# Patient Record
Sex: Female | Born: 1975
Health system: Southern US, Community
[De-identification: ages and names within clinical notes are randomized; demographics above are authoritative.]

## PROBLEM LIST (undated history)

## (undated) ENCOUNTER — Inpatient Hospital Stay (HOSPITAL_COMMUNITY): Payer: Self-pay

## (undated) DIAGNOSIS — T7840XA Allergy, unspecified, initial encounter: Secondary | ICD-10-CM

## (undated) DIAGNOSIS — G43909 Migraine, unspecified, not intractable, without status migrainosus: Secondary | ICD-10-CM

## (undated) DIAGNOSIS — D649 Anemia, unspecified: Secondary | ICD-10-CM

## (undated) DIAGNOSIS — K5900 Constipation, unspecified: Secondary | ICD-10-CM

## (undated) HISTORY — DX: Migraine, unspecified, not intractable, without status migrainosus: G43.909

## (undated) HISTORY — PX: HERNIA REPAIR: SHX51

## (undated) HISTORY — DX: Constipation, unspecified: K59.00

## (undated) HISTORY — DX: Allergy, unspecified, initial encounter: T78.40XA

## (undated) HISTORY — DX: Anemia, unspecified: D64.9

## (undated) HISTORY — PX: WISDOM TOOTH EXTRACTION: SHX21

---

## 1999-12-22 ENCOUNTER — Other Ambulatory Visit: Admission: RE | Admit: 1999-12-22 | Discharge: 1999-12-22 | Payer: Self-pay | Admitting: *Deleted

## 2000-04-09 ENCOUNTER — Inpatient Hospital Stay (HOSPITAL_COMMUNITY): Admission: AD | Admit: 2000-04-09 | Discharge: 2000-04-09 | Payer: Self-pay | Admitting: Obstetrics and Gynecology

## 2000-08-24 ENCOUNTER — Inpatient Hospital Stay (HOSPITAL_COMMUNITY): Admission: AD | Admit: 2000-08-24 | Discharge: 2000-08-28 | Payer: Self-pay | Admitting: Obstetrics and Gynecology

## 2000-08-24 ENCOUNTER — Encounter (INDEPENDENT_AMBULATORY_CARE_PROVIDER_SITE_OTHER): Payer: Self-pay | Admitting: Specialist

## 2001-02-21 ENCOUNTER — Emergency Department (HOSPITAL_COMMUNITY): Admission: EM | Admit: 2001-02-21 | Discharge: 2001-02-21 | Payer: Self-pay | Admitting: Emergency Medicine

## 2001-02-21 ENCOUNTER — Encounter: Payer: Self-pay | Admitting: Emergency Medicine

## 2004-01-07 ENCOUNTER — Other Ambulatory Visit: Admission: RE | Admit: 2004-01-07 | Discharge: 2004-01-07 | Payer: Self-pay | Admitting: Family Medicine

## 2004-10-13 ENCOUNTER — Other Ambulatory Visit: Admission: RE | Admit: 2004-10-13 | Discharge: 2004-10-13 | Payer: Self-pay | Admitting: Obstetrics and Gynecology

## 2007-08-24 DIAGNOSIS — J45909 Unspecified asthma, uncomplicated: Secondary | ICD-10-CM

## 2007-08-24 HISTORY — DX: Unspecified asthma, uncomplicated: J45.909

## 2007-08-29 DIAGNOSIS — J309 Allergic rhinitis, unspecified: Secondary | ICD-10-CM

## 2007-08-29 HISTORY — DX: Allergic rhinitis, unspecified: J30.9

## 2007-11-11 DIAGNOSIS — N946 Dysmenorrhea, unspecified: Secondary | ICD-10-CM

## 2007-11-11 HISTORY — DX: Dysmenorrhea, unspecified: N94.6

## 2008-08-06 ENCOUNTER — Encounter: Admission: RE | Admit: 2008-08-06 | Discharge: 2008-08-06 | Payer: Self-pay | Admitting: Family Medicine

## 2010-10-10 NOTE — Op Note (Signed)
[facility]  Patient:    Mallory Martin, Mallory Martin                      MRN: [id number] Proc. Date: 08/25/00 Adm. Date:  [id number] Attending:  Leonard Schwartz                           Operative Report  PREOPERATIVE DIAGNOSES:       1. A 41-1/[redacted] week gestation.                               2. Arrest of active phase of labor.  POSTOPERATIVE DIAGNOSES:      1. A 41-1/[redacted] week gestation.                               2. Arrest of active phase of labor.                               3. Left dermoid cyst.  PROCEDURES:                   1. Primary low transverse cesarean section.                               2. Left ovarian cystectomy.  SURGEON:                      Janine Limbo, M.D.  FIRST ASSISTANT:              Nigel Bridgeman, C.N.M.  ANESTHESIA:                   Epidural.  INDICATIONS:                  The patient is a 35 year old female, gravida 1, para 0 who presents at 41-1/[redacted] weeks gestation for induction of labor.  She has been followed at [facility] for this pregnancy, which has been complicated by a history of asthma, first trimester bleeding, history of migraine headaches and a history of a first trimester Chlamydia cervicitis infection.  She was started on Pitocin and dilated her cervix to 6-7 cm.  She dilated no further in spite of greater than 200 Montevideo units for greater than two hours.  The decision was made to proceed with cesarean delivery.  The risks and benefits of the procedure were reviewed including but not limited to anesthetic complications, bleeding, infection and possible damage to the surrounding organs.  FINDINGS:                     An 8 lb 2 oz female infant (Dorian) was delivered from an occiput posterior presentation.  Apgars were 8 at one minute and 9 at five minutes.  The uterus, fallopian tubes and right ovary were normal.  The left ovary contained a 5 cm dermoid  cyst.  DESCRIPTION OF PROCEDURE:     The patient was taken to the operating room, where it was determined that the epidural she had received for labor would be adequate for cesarean delivery.  The patients abdomen was prepped with multiple layers of Betadine and then  sterilely draped.  The bladder had previously been drained with Foley catheter.  A low transverse incision was made in the abdomen and carried sharply through the subcutaneous tissue, the fascia and the anterior peritoneum.  An incision was made in the lower uterine segment and extended transversely.  The fetal head was delivered without difficulty.  The mouth and nose were suctioned.  The remainder of the infant was delivered.  The cord was clamped and cut.  The infant was handed to the awaiting pediatric team.  Routine cord blood studies were obtained.  The placenta was removed.  The uterine cavity was cleaned of amniotic fluid and clotted blood.  The uterine incision was closed using a running locking suture of 2-0 Vicryl.  The uterus was slightly boggy and, therefore, uterine massage was employed as well as IV Pitocin at a vigorous rate.  The pericolonic gutters were cleaned of amniotic fluid and clotted blood.  The left ovary was discovered to have a 5 cm dermoid cyst.  An incision was made in the capsule of the ovary and then a combination of sharp, blunt and hydrodissection were used to remove the cyst without rupturing it.  The ovary was closed using a running suture of 3-0 Vicryl.  Hemostasis was adequate throughout.  The pelvis was again irrigated.  The abdominal musculature and the anterior peritoneum were closed using interrupted sutures of 2-0 Vicryl.  The subcutaneous layer, the fascia and the abdominal musculature were irrigated.  Hemostasis was adequate.  The fascia was closed using a running suture of 0 Vicryl followed by three interrupted sutures of 0 Vicryl.  The subcutaneous layer was closed using a running  suture of 2-0 Vicryl.  The skin was reapproximated using skin staples.  Sponge, needle and instrument counts were correct on two occasions. Estimated blood loss for the procedure was 900 cc.  The patient tolerated the procedure well.  She was noted to drain clear yellow urine at the end of her procedure.  The patient was taken to the recovery room in stable condition. The infant was taken to the full term nursery in stable condition. DD:  08/25/00 TD:  08/26/00 Job: [id number]

## 2010-10-10 NOTE — Discharge Summary (Signed)
[facility]  Patient:    Mallory Martin, Mallory Martin                      MRN: [id number] Adm. Date:  [id number] Disc. Date: 08/28/00 Attending:  Leonard Schwartz Dictator:   Vance Gather Duplantis, C.N.M.                           Discharge Summary  ADMISSION DIAGNOSES:          1. Intrauterine pregnancy at 41-4/7 weeks.                               2. Unfavorable cervix.  DISCHARGE DIAGNOSES:          1. Intrauterine pregnancy at 41-4/7 weeks.                               2. Unfavorable cervix.                               3. Failure to progress in labor status post low                                  transverse cesarean section.                               4. Left dermoid ovarian cyst.                               5. Breast and bottle feeding.                               6. Decided on Micronor for contraception.  PROCEDURES:                   Primary low transverse cesarean section for delivery of a viable female infant named Mallory Martin who weighed 8 pounds 2 ounces with Apgars of 8/9 by Dr. Leonard Schwartz.  During the C section, he also removed a 5 cm left ovarian dermoid cyst.  HOSPITAL COURSE:              Mallory Martin is a 35 year old single black female, gravida 1, para 0, at 41-4/7 weeks admitted for induction of labor secondary to being post dates.  She was admitted for cervical ripening on low-dose Pitocin throughout the night and started that, increasing on the morning of August 25, 2000.  She progressed to approximately 6 to 7 cm but failed to continue to progress even with adequate Montevideo units for more than two hours.  At this point, it was recommended to proceed with a cesarean section. The patient agreed and underwent the same for delivery of a viable female infant named Mallory Martin who weighted 8 pounds 2 ounces and had Apgars of 8/9.  She was attended at her C section by Dr. Leonard Schwartz and Nigel Bridgeman, C.N.M.  Postoperatively, she has done well.  She is ambulating, voiding, and eating without difficulty.  She is breast feeding and bottle feeding also  without difficulty.  Her vital signs are stable, and she is afebrile.  She is deemed ready for discharge today.  DISCHARGE INSTRUCTIONS:       As per the [facility] handout.  DISCHARGE MEDICATIONS:        1. Motrin 600 mg p.o. q.6h. p.r.n. pain.                               2. Darvocet 1 to 2 p.o. q.4-6h. p.r.n. pain.                               3. Prenatal vitamins daily.  DISCHARGE LABORATORY DATA:    Hemoglobin 11.2, WBC count 19.2, platelets 288.  DISCHARGE FOLLOWUP:           [facility] in approximately six weeks or p.r.n. DD:  08/28/00 TD:  08/28/00 Job: [id number]
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
## 2011-10-20 ENCOUNTER — Encounter (HOSPITAL_COMMUNITY): Payer: Self-pay | Admitting: *Deleted

## 2011-10-20 ENCOUNTER — Emergency Department (HOSPITAL_COMMUNITY)
Admission: EM | Admit: 2011-10-20 | Discharge: 2011-10-20 | Disposition: A | Discharge status: Home / Self Care | Payer: No Typology Code available for payment source | Attending: Emergency Medicine | Admitting: Emergency Medicine

## 2011-10-20 ENCOUNTER — Emergency Department (HOSPITAL_COMMUNITY): Payer: No Typology Code available for payment source

## 2011-10-20 DIAGNOSIS — M25569 Pain in unspecified knee: Secondary | ICD-10-CM | POA: Insufficient documentation

## 2011-10-20 DIAGNOSIS — M25519 Pain in unspecified shoulder: Secondary | ICD-10-CM | POA: Insufficient documentation

## 2011-10-20 DIAGNOSIS — M545 Low back pain, unspecified: Secondary | ICD-10-CM

## 2011-10-20 DIAGNOSIS — J45909 Unspecified asthma, uncomplicated: Secondary | ICD-10-CM | POA: Insufficient documentation

## 2011-10-20 MED ORDER — HYDROCODONE-ACETAMINOPHEN 5-325 MG PO TABS: 1.0000 | ORAL_TABLET | ORAL | Status: AC | PRN

## 2011-10-20 MED ORDER — HYDROCODONE-ACETAMINOPHEN 5-325 MG PO TABS
1.0000 | ORAL_TABLET | Freq: Once | ORAL | Status: AC
  Administered 2011-10-20: 1 via ORAL
  Filled 2011-10-20: qty 1

## 2011-10-20 NOTE — ED Provider Notes (Signed)
Medical screening examination/treatment/procedure(s) were performed by non-physician practitioner and as supervising physician I was immediately available for consultation/collaboration.   Celene Kras, MD 10/20/11 2105
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
## 2011-10-21 NOTE — ED Notes (Signed)
Requests something for nausea; chart to EDP office for review.

## 2011-10-22 NOTE — ED Notes (Signed)
Prescription called in to [facility] [address] at [phone] for zofran 4mg  one tab po q4h prn nausea/vomiting, dispense 12, no refills per stephanie wolfe, pa-c; message left for pt to return call for notification that med has been called in.

## 2011-11-23 DIAGNOSIS — R768 Other specified abnormal immunological findings in serum: Secondary | ICD-10-CM

## 2011-11-23 HISTORY — DX: Other specified abnormal immunological findings in serum: R76.8

## 2011-12-02 ENCOUNTER — Emergency Department (HOSPITAL_COMMUNITY)
Admission: EM | Admit: 2011-12-02 | Discharge: 2011-12-02 | Disposition: A | Discharge status: Home / Self Care | Payer: BC Managed Care – PPO | Attending: Emergency Medicine | Admitting: Emergency Medicine

## 2011-12-02 ENCOUNTER — Encounter (HOSPITAL_COMMUNITY): Payer: Self-pay | Admitting: *Deleted

## 2011-12-02 DIAGNOSIS — Z203 Contact with and (suspected) exposure to rabies: Secondary | ICD-10-CM | POA: Insufficient documentation

## 2011-12-02 DIAGNOSIS — Z23 Encounter for immunization: Secondary | ICD-10-CM | POA: Insufficient documentation

## 2011-12-02 MED ORDER — RABIES VACCINE, PCEC IM SUSR
1.0000 mL | Freq: Once | INTRAMUSCULAR | Status: AC
  Administered 2011-12-02: 1 mL via INTRAMUSCULAR
  Filled 2011-12-02: qty 1

## 2011-12-02 MED ORDER — RABIES IMMUNE GLOBULIN 150 UNIT/ML IM INJ
20.0000 [IU]/kg | INJECTION | Freq: Once | INTRAMUSCULAR | Status: AC
  Administered 2011-12-02: 1275 [IU] via INTRAMUSCULAR
  Filled 2011-12-02: qty 8.5

## 2011-12-02 NOTE — ED Notes (Signed)
Copy of schedule faxed to Urgent Care and Pharmacy 

## 2011-12-02 NOTE — ED Provider Notes (Signed)
History     CSN: [id number]  Arrival date & time 12/02/11  1655   First MD Initiated Contact with Patient 12/02/11 1711      Chief Complaint  Patient presents with  . Animal Bite    (Consider location/radiation/quality/duration/timing/severity/associated sxs/prior treatment) HPI Mother woke and found bat swarming around in the house and swiped at it. They were able to kill it and animal control was called and have the bat in custody a this time. Patient denies any possible bites.  Past Medical History  Diagnosis Date  . Asthma     Past Surgical History  Procedure Date  . Cesarean section   . Wisdom tooth extraction     No family history on file.  History  Substance Use Topics  . Smoking status: Never Smoker   . Smokeless tobacco: Not on file  . Alcohol Use: No    OB History    Grav Para Term Preterm Abortions TAB SAB Ect Mult Living                  Review of Systems  All other systems reviewed and are negative.    Allergies  Advil; Excedrin extra strength; Oxycodone; and Vicodin  Home Medications   Current Outpatient Rx  Name Route Sig Dispense Refill  . VITAMIN C PO CHEW Oral Chew 2 tablets by mouth daily.    [name] DIPHENHYDRAMINE HCL 12.5 MG PO CHEW Oral Chew 37.5 mg by mouth 4 (four) times daily as needed. For allergies.    [name] LORATADINE 10 MG PO TABS Oral Take 10 mg by mouth daily.    [name] MONTELUKAST SODIUM 5 MG PO CHEW Oral Chew 10 mg by mouth at bedtime as needed. For allergies.    [name] VITAMIN D (ERGOCALCIFEROL) 50000 UNITS PO CAPS Oral Take 50,000 Units by mouth every 7 (seven) days. Take on saturdays      BP 103/69  Pulse 79  Temp 98.6 F (37 C)  Resp 18  Wt 143 lb 4.8 oz (65 kg)  SpO2 100%  Physical Exam  Nursing note and vitals reviewed. Constitutional: She appears well-developed and well-nourished. No distress.  HENT:  Head: Normocephalic and atraumatic.  Right Ear: External ear normal.  Left Ear: External ear normal.  Eyes: Conjunctivae  are normal. Right eye exhibits no discharge. Left eye exhibits no discharge. No scleral icterus.  Neck: Neck supple. No tracheal deviation present.  Cardiovascular: Normal rate.   Pulmonary/Chest: Effort normal. No stridor. No respiratory distress.  Musculoskeletal: She exhibits no edema.  Neurological: She is alert. Cranial nerve deficit: no gross deficits.  Skin: Skin is warm and dry. No abrasion and no rash noted.       No puncture wounds noted on skin at this time  Psychiatric: She has a normal mood and affect.    ED Course  Procedures (including critical care time)  Labs Reviewed - No data to display No results found.   1. Rabies exposure       MDM  At this time rabies vaccines started and will continue as outpatient for other care. Family questions answered and reassurance given and agrees with d/c and plan at this time. No reaction post immunization               Rusell Meneely C. Sandor Arboleda, DO 12/02/11 1926

## 2011-12-02 NOTE — ED Notes (Signed)
Pt had a bat in her apartment this morning.  She said it was on her and she swatted at it.  Her son killed it with a shoe.  Animal control has the bat and the health dept suggested she come her to get the first set of rabies shots just in case.

## 2011-12-23 ENCOUNTER — Telehealth (HOSPITAL_COMMUNITY): Payer: Self-pay | Admitting: *Deleted

## 2011-12-23 NOTE — ED Notes (Signed)
I called and left a message to call about rescheduling rabies vaccine appointments. No show on 7/13. Vassie Moselle 12/23/2011

## 2011-12-25 ENCOUNTER — Telehealth (HOSPITAL_COMMUNITY): Payer: Self-pay | Admitting: *Deleted

## 2011-12-25 NOTE — ED Notes (Signed)
Pt. called back and said the bat was [facility] the state [facility] and it was not rabid so she was told she did not have to finish the rabies series. I told her I would document this on her chart and her child's chart. Vassie Moselle 12/25/2011

## 2013-04-19 DIAGNOSIS — L918 Other hypertrophic disorders of the skin: Secondary | ICD-10-CM

## 2013-04-19 DIAGNOSIS — M26609 Unspecified temporomandibular joint disorder, unspecified side: Secondary | ICD-10-CM

## 2013-04-19 DIAGNOSIS — Z3169 Encounter for other general counseling and advice on procreation: Secondary | ICD-10-CM | POA: Insufficient documentation

## 2013-04-19 HISTORY — DX: Unspecified temporomandibular joint disorder, unspecified side: M26.609

## 2013-04-19 HISTORY — DX: Encounter for other general counseling and advice on procreation: Z31.69

## 2013-04-19 HISTORY — DX: Other hypertrophic disorders of the skin: L91.8

## 2013-08-18 LAB — OB RESULTS CONSOLE HIV ANTIBODY (ROUTINE TESTING): HIV: NONREACTIVE

## 2013-08-18 LAB — OB RESULTS CONSOLE ABO/RH: RH Type: POSITIVE

## 2013-08-18 LAB — OB RESULTS CONSOLE RUBELLA ANTIBODY, IGM: Rubella: IMMUNE

## 2013-08-18 LAB — OB RESULTS CONSOLE RPR: RPR: NONREACTIVE

## 2013-08-18 LAB — OB RESULTS CONSOLE ANTIBODY SCREEN: Antibody Screen: NEGATIVE

## 2013-08-18 LAB — OB RESULTS CONSOLE HEPATITIS B SURFACE ANTIGEN: HEP B S AG: NEGATIVE

## 2013-09-20 ENCOUNTER — Other Ambulatory Visit: Payer: Self-pay

## 2013-10-04 ENCOUNTER — Other Ambulatory Visit (HOSPITAL_COMMUNITY): Payer: Self-pay | Admitting: Obstetrics

## 2013-10-04 DIAGNOSIS — O283 Abnormal ultrasonic finding on antenatal screening of mother: Secondary | ICD-10-CM

## 2013-10-04 DIAGNOSIS — O09529 Supervision of elderly multigravida, unspecified trimester: Secondary | ICD-10-CM

## 2013-10-04 DIAGNOSIS — Z3689 Encounter for other specified antenatal screening: Secondary | ICD-10-CM

## 2013-10-11 ENCOUNTER — Ambulatory Visit (HOSPITAL_COMMUNITY): Payer: BC Managed Care – PPO

## 2013-10-11 ENCOUNTER — Ambulatory Visit (HOSPITAL_COMMUNITY)
Admission: RE | Admit: 2013-10-11 | Discharge: 2013-10-11 | Disposition: A | Discharge status: Home / Self Care | Payer: BC Managed Care – PPO | Source: Ambulatory Visit | Attending: Obstetrics | Admitting: Obstetrics

## 2013-10-11 ENCOUNTER — Encounter (HOSPITAL_COMMUNITY): Payer: Self-pay

## 2013-10-11 DIAGNOSIS — O289 Unspecified abnormal findings on antenatal screening of mother: Secondary | ICD-10-CM | POA: Insufficient documentation

## 2013-10-11 DIAGNOSIS — Z3689 Encounter for other specified antenatal screening: Secondary | ICD-10-CM

## 2013-10-11 DIAGNOSIS — IMO0002 Reserved for concepts with insufficient information to code with codable children: Secondary | ICD-10-CM | POA: Insufficient documentation

## 2013-10-11 DIAGNOSIS — O283 Abnormal ultrasonic finding on antenatal screening of mother: Secondary | ICD-10-CM

## 2013-10-11 DIAGNOSIS — O3510X0 Maternal care for (suspected) chromosomal abnormality in fetus, unspecified, not applicable or unspecified: Secondary | ICD-10-CM | POA: Insufficient documentation

## 2013-10-11 DIAGNOSIS — O09529 Supervision of elderly multigravida, unspecified trimester: Secondary | ICD-10-CM | POA: Insufficient documentation

## 2013-10-11 DIAGNOSIS — O351XX0 Maternal care for (suspected) chromosomal abnormality in fetus, not applicable or unspecified: Secondary | ICD-10-CM | POA: Insufficient documentation

## 2013-10-11 NOTE — Progress Notes (Signed)
Genetic Counseling  High-Risk Gestation Note  Appointment Date:  10/11/2013 Referred By: Ala Dach., MD Date of Birth:  September 02, 1975 Partner:  Royden Purl   Pregnancy History: [id number] Estimated Date of Delivery: 03/25/14 Estimated Gestational Age: [redacted]w[redacted]d  I met with [name] and her husband, Mr. Mallory Martin, for genetic counseling because of a maternal age of 38, an increased risk for fetal Down syndrome based on First trimester screening through [facility], and an abnormal InformaSeq result.  In addition, the fetal nuchal translucency was enlarged.  This couple was counseled regarding maternal age and the association with risk for chromosome conditions due to nondisjunction with aging of the ova.   We reviewed chromosomes, nondisjunction, and the associated 1 in 5 risk for fetal aneuploidy related to a maternal age of 63 at [redacted]w[redacted]d weeks gestation.  They were counseled that the risk for aneuploidy decreases as gestational age increases, accounting for those pregnancies which spontaneously abort.  We specifically discussed Down syndrome (trisomy 48), trisomies 12 and 24, and sex chromosome aneuploidies (47,XXX and 47,XXY) including the common features and prognoses of each.   She was then counseled regarding the First trimester screening result and the associated 1 in 5 risk for fetal Down syndrome. In addition, we reviewed the screen adjusted reduction in risks for trisomy 13/18.  We discussed that the First trimester screen combines both biochemical analysis (levels of PAPP-A and free beta hCG) and ultrasound (fetal nuchal translucency and nasal bone) to adjust the maternal age related risk of aneuploidy, providing a pregnancy specific risk assessment.  We then discussed that the fetal nuchal translucency measurement (4.3 mm at [redacted]w[redacted]d) was at the 99th percentile for the gestational age. We reviewed that the fetal NT refers to a fluid filled space between the skin and soft tissues behind  the cervical spine. This space is traditionally measurable between 11 and 13.[redacted] weeks gestation and is considered enlarged when greater than the 95th percentile for gestational age. They were counseled regarding the various common etiologies for an enlarged NT including: aneuploidy, single gene conditions, cardiac or great vessel abnormalities, lymphatic system failure, decreased fetal movement, and fetal anemia.   Given that enlarged NT measurements are associated with an increased risk for a fetal cardiac defect, we discussed the option of a fetal echocardiogram. The patient expressed interest in having a fetal echocardiogram.  We then discussed that a small percentage of increased nuchal translucency values are related to an underlying single gene condition. She was counseled that single gene conditions are not routinely tested for prenatally unless the ultrasound findings or family history significantly increase the suspicion of a specific single gene disorder. We briefly reviewed common inheritance patterns (dominant, recessive, and X-linked) as well as the associated risks of recurrence.   Regarding the increased risk for a fetal chromosome condition, [name] had NIPS/cffDNA (InformaSeq) testing through her primary obstetrician's office.  We reviewed that these results were negative ("no aneuploidy detected") for trisomies 21, 13, and 18.  However, we discussed that the sex chromosome analysis was abnormal, indicating an increased risk for fetal trisomy X ("aneuploidy detected").  We reviewed that NIPS cannot distinguish between aneuploidy confined to the placenta, trisomy X in the fetus, mosaic trisomy X, or an underlying maternal X chromosome aneuploidy. In addition, literature suggests that the false positive rate may be higher for sex chromosome aneuploidy due to normal mitotic changes in maternal cells.  Regarding the specificity and sensitivity of sex chromosome analysis, InformaSeq reports that  there is limited data for more rare sex chromosome aneuploidies which precludes performance calculations.    We discussed the availability of amniocentesis and postnatal peripheral blood chromosome analysis for confirmation of the diagnosis. We discussed the risks, benefits, and limitations of amniocentesis including the approximately 1 in 300-500 risk of amniocentesis, including spontaneous pregnancy loss.  We also discussed the option of repeating the NIPS testing through a different laboratory, specifically [facility] ([facility]), in which the methodology is different and the risk for a false positive for sex chromosome aneuploidy is lower.  We also discussed the option of detailed ultrasound to assess fetal growth and anatomy in detail.  After careful consideration, [name] declined [facility] and amniocentesis.  She elected to proceed with a detailed ultrasound.  The report will be documented separately.  Given the early gestational age, the anatomy was not fully visualized.  However, the fetus had a borderline increased nuchal fold (likely due to the aforementioned increased NT), an EIF, and renal pyelectasis.  We reviewed that the finding of several soft markers significantly increases the likelihood of aneuploidy.  Given that InformaSeq was normal for trisomies 13, 18, and 21, we discussed that these findings are likely less concerning.  We did review that NIPS is not diagnostic and that although the chance is low, there are cases of aneuploidy missed with this technology.  Since renal pyelectasis can progress to congenital hydronephrosis, follow up ultrasounds are recommended.  After reviewing this information, [name] again declined having a repeat NIPS ([facility]) and amniocentesis.  Considering the InformaSeq result, we reviewed that trisomy X, or 49, XXX, describes a female who has an additional X chromosome.  We discussed that most females with trisomy X will have some, but not all possible features of  the condition.  Physically, we discussed that females with trisomy X are often taller than average, though this is not always the case. Subtle physical differences may also include epicanthal folds and/or clinodactyly.  Typically, trisomy X is not associated with major congenital malformations. However, there are reports of females with trisomy X who have birth defects, with genitourinary malformations among the most common type that occur (approximately 5-16% of cases). We discussed the increased chance for hypotonia, possibly resulting in gross and fine motor developmental delay. Most individuals with trisomy X have intelligence in the normal or low-normal range, but there is an increased chance for learning disabilities, particularly related to speech and language skills.  An increased chance for psychiatric/behavioral disorders, epilepsy, and possibly premature ovarian failure has also been described for a subset of patients with triple X.    Mrs. Billingham was counseled that trisomy X has an approximate incidence of 1 in 1,000. However, there is a high degree of variability seen among females who have this condition, meaning that every child with trisomy X will not be affected in exactly the same way, and some children will have more or less features than others. It is estimated that only 10% of individuals with trisomy X are undiagnosed given in part to many individuals being mildly affected or asymptomatic.  We discussed that due to the higher chance for developmental delays, early intervention programs may be helpful for children with trisomy X. Early intervention services such as physical, occupational, and speech therapies can help with achievement of developmental milestones. These therapies are typically provided to children (with qualifying diagnoses) from birth until age 25.  Given the variability of the symptoms associated with trisomy X, specific outcomes for each individual cannot  be predicted.  Mrs.  Reitz was provided with written information regarding sickle cell anemia (SCA) including the carrier frequency and incidence in the African-American population, the availability of carrier testing and prenatal diagnosis if indicated.  In addition, we discussed that hemoglobinopathies are routinely screened for as part of the [place] newborn screening panel.  She declined hemoglobin electrophoresis today.  Both family histories were reviewed and found to be noncontributory for birth defects, mental retardation, and known genetic conditions. Without further information regarding the provided family history, an accurate genetic risk cannot be calculated. Further genetic counseling is warranted if more information is obtained.  Mrs. Linam denied exposure to environmental toxins or chemical agents. She denied the use of alcohol, tobacco or street drugs. She denied significant viral illnesses during the course of her pregnancy.   I counseled this couple regarding the above risks and available options.  The approximate face-to-face time with the genetic counselor was 44 minutes.  Sharyne Richters, MS Certified Genetic Counselor

## 2013-11-12 ENCOUNTER — Inpatient Hospital Stay (HOSPITAL_COMMUNITY)
Admission: AD | Admit: 2013-11-12 | Discharge: 2013-11-12 | Disposition: A | Discharge status: Home / Self Care | Payer: BC Managed Care – PPO | Source: Ambulatory Visit | Attending: Obstetrics and Gynecology | Admitting: Obstetrics and Gynecology

## 2013-11-12 ENCOUNTER — Encounter (HOSPITAL_COMMUNITY): Payer: Self-pay | Admitting: *Deleted

## 2013-11-12 DIAGNOSIS — O99891 Other specified diseases and conditions complicating pregnancy: Secondary | ICD-10-CM | POA: Insufficient documentation

## 2013-11-12 DIAGNOSIS — A499 Bacterial infection, unspecified: Secondary | ICD-10-CM | POA: Insufficient documentation

## 2013-11-12 DIAGNOSIS — N760 Acute vaginitis: Secondary | ICD-10-CM | POA: Insufficient documentation

## 2013-11-12 DIAGNOSIS — R109 Unspecified abdominal pain: Secondary | ICD-10-CM | POA: Insufficient documentation

## 2013-11-12 DIAGNOSIS — B9689 Other specified bacterial agents as the cause of diseases classified elsewhere: Secondary | ICD-10-CM | POA: Insufficient documentation

## 2013-11-12 DIAGNOSIS — N859 Noninflammatory disorder of uterus, unspecified: Secondary | ICD-10-CM

## 2013-11-12 DIAGNOSIS — O9989 Other specified diseases and conditions complicating pregnancy, childbirth and the puerperium: Principal | ICD-10-CM

## 2013-11-12 DIAGNOSIS — N858 Other specified noninflammatory disorders of uterus: Secondary | ICD-10-CM

## 2013-11-12 DIAGNOSIS — O2390 Unspecified genitourinary tract infection in pregnancy, unspecified trimester: Secondary | ICD-10-CM | POA: Insufficient documentation

## 2013-11-12 LAB — URINE MICROSCOPIC-ADD ON

## 2013-11-12 LAB — URINALYSIS, ROUTINE W REFLEX MICROSCOPIC
BILIRUBIN URINE: NEGATIVE
Glucose, UA: NEGATIVE mg/dL
Ketones, ur: NEGATIVE mg/dL
Leukocytes, UA: NEGATIVE
NITRITE: NEGATIVE
Protein, ur: NEGATIVE mg/dL
SPECIFIC GRAVITY, URINE: 1.015 (ref 1.005–1.030)
UROBILINOGEN UA: 0.2 mg/dL (ref 0.0–1.0)
pH: 6 (ref 5.0–8.0)

## 2013-11-12 NOTE — MAU Provider Note (Signed)
History     CSN: [id number]  Arrival date and time: 11/12/13 [address] notified and verbal orders given: [address] on unit: [address] at bedside: 1215     Chief Complaint  Patient presents with  . Abdominal Cramping   HPI  Ms. Mallory Martin is a 38 yo G2P1001 female at 3821 wks gestation presenting today with complaints of worsening uterine cramping since 1030.  [facility] and tearful.  She was seen in the office Tuesday 6/16 with the same complaints.  She is currently being treated for BV (dx'd 6/16).  She is hydrating with water, but admittedly not enough.  Denies VB or LOF.  Not feeling much FM yet.  Past Medical History  Diagnosis Date  . Asthma     Past Surgical History  Procedure Laterality Date  . Cesarean section    . Wisdom tooth extraction      No family history on file.  History  Substance Use Topics  . Smoking status: Never Smoker   . Smokeless tobacco: Not on file  . Alcohol Use: No    Allergies:  Allergies  Allergen Reactions  . Advil [Ibuprofen] Swelling  . Excedrin Extra Strength [Aspirin-Acetaminophen-Caffeine] Swelling  . Oxycodone Nausea Only  . Vicodin [Hydrocodone-Acetaminophen] Nausea Only    Prescriptions prior to admission  Medication Sig Dispense Refill  . acetaminophen (TYLENOL) 500 MG tablet Take 250 mg by mouth every 6 (six) hours as needed for moderate pain.      . diphenhydrAMINE (BENADRYL) 12.5 MG chewable tablet Chew 37.5 mg by mouth 4 (four) times daily as needed. For allergies.      . diphenhydramine-acetaminophen (TYLENOL PM) 25-500 MG TABS Take 1 tablet by mouth at bedtime as needed. sleep      . Fexofenadine-Pseudoephedrine (ALLEGRA-D PO) Take 1 tablet by mouth daily as needed (allergies).      . Prenatal Vit-Fe Fumarate-FA (PRENATAL MULTIVITAMIN) TABS tablet Take 1 tablet by mouth daily at 12 noon.      [name]. PRESCRIPTION MEDICATION Take 1 tablet by mouth 2 (two) times daily.  Antibiotic for infection stared last week has approximately 2 days left to take.        Review of Systems  Constitutional: Negative.   HENT: Negative.   Eyes: Negative.   Respiratory: Negative.   Cardiovascular: Negative.   Gastrointestinal:       More frequent stool  Genitourinary:       Uterine cramping  Musculoskeletal: Negative.   Skin: Negative.   Neurological: Negative.   Endo/Heme/Allergies: Negative.   Psychiatric/Behavioral: Negative.    Toco: Occ. UI noted  Results for orders placed during the hospital encounter of 11/12/13 (from the past 24 hour(s))  URINALYSIS, ROUTINE W REFLEX MICROSCOPIC     Status: Abnormal   Collection Time    11/12/13 12:48 PM      Result Value Ref Range   Color, Urine YELLOW  YELLOW   APPearance CLEAR  CLEAR   Specific Gravity, Urine 1.015  1.005 - 1.030   pH 6.0  5.0 - 8.0   Glucose, UA NEGATIVE  NEGATIVE mg/dL   Hgb urine dipstick TRACE (*) NEGATIVE   Bilirubin Urine NEGATIVE  NEGATIVE   Ketones, ur NEGATIVE  NEGATIVE mg/dL   Protein, ur NEGATIVE  NEGATIVE mg/dL   Urobilinogen, UA 0.2  0.0 - 1.0 mg/dL   Nitrite NEGATIVE  NEGATIVE   Leukocytes, UA NEGATIVE  NEGATIVE  URINE MICROSCOPIC-ADD ON  Status: Abnormal   Collection Time    11/12/13 12:48 PM      Result Value Ref Range   Squamous Epithelial / LPF FEW (*) RARE   WBC, UA 0-2  <3 WBC/hpf   RBC / HPF 3-6  <3 RBC/hpf   Physical Exam   Blood pressure 116/68, pulse 89, temperature 98 F (36.7 C), resp. rate 18, last menstrual period 06/18/2013.  Physical Exam  Constitutional: She is oriented to person, place, and time. She appears well-developed and well-nourished.  HENT:  Head: Normocephalic and atraumatic.  Eyes: Pupils are equal, round, and reactive to light.  Neck: Normal range of motion. Neck supple.  Cardiovascular: Normal rate, regular rhythm and normal heart sounds.   Respiratory: Effort normal and breath sounds normal.  GI: Soft. Bowel sounds are normal.   Genitourinary: Vagina normal and uterus normal.  Currently being treated for BV; cervix closed/thick/high/soft  Musculoskeletal: Normal range of motion.  Neurological: She is alert and oriented to person, place, and time. She has normal reflexes.  Skin: Skin is warm and dry.  Psychiatric: She has a normal mood and affect. Her behavior is normal. Judgment and thought content normal.    MAU Course  Procedures CCUA Urine culture Continuous toco FHTs Assessment and Plan  38 yo G2P1001 @ [redacted] wks gestation Uterine cramping  Discharge Home Increase daily PO fluids Increase rest today Pelvic rest until next appt Call the office, if symptoms worsen Keep appt on 6/23   Raelyn MoraAWSON, ROLITTA, M, MSN, CNM 11/12/2013, 12:18 PM

## 2013-11-12 NOTE — Progress Notes (Signed)
Notified pt in MAU via wc, very uncomfortable, feels like cannot sit down, orders obtained, will come see in MAU.

## 2013-11-12 NOTE — MAU Note (Signed)
Pt states that she was started on Flagyl on Tuesday for BV.

## 2013-11-12 NOTE — Discharge Instructions (Signed)
Bacterial Vaginosis Bacterial vaginosis is a vaginal infection that occurs when the normal balance of bacteria in the vagina is disrupted. It results from an overgrowth of certain bacteria. This is the most common vaginal infection in women of childbearing age. Treatment is important to prevent complications, especially in pregnant women, as it can cause a premature delivery. CAUSES  Bacterial vaginosis is caused by an increase in harmful bacteria that are normally present in smaller amounts in the vagina. Several different kinds of bacteria can cause bacterial vaginosis. However, the reason that the condition develops is not fully understood. RISK FACTORS Certain activities or behaviors can put you at an increased risk of developing bacterial vaginosis, including:  Having a new sex partner or multiple sex partners.  Douching.  Using an intrauterine device (IUD) for contraception. Women do not get bacterial vaginosis from toilet seats, bedding, swimming pools, or contact with objects around them. SIGNS AND SYMPTOMS  Some women with bacterial vaginosis have no signs or symptoms. Common symptoms include:  Grey vaginal discharge.  A fishlike odor with discharge, especially after sexual intercourse.  Itching or burning of the vagina and vulva.  Burning or pain with urination. DIAGNOSIS  Your health care provider will take a medical history and examine the vagina for signs of bacterial vaginosis. A sample of vaginal fluid may be taken. Your health care provider will look at this sample under a microscope to check for bacteria and abnormal cells. A vaginal pH test may also be done.  TREATMENT  Bacterial vaginosis may be treated with antibiotic medicines. These may be given in the form of a pill or a vaginal cream. A second round of antibiotics may be prescribed if the condition comes back after treatment.  HOME CARE INSTRUCTIONS   Only take over-the-counter or prescription medicines as  directed by your health care provider.  If antibiotic medicine was prescribed, take it as directed. Make sure you finish it even if you start to feel better.  Do not have sex until treatment is completed.  Tell all sexual partners that you have a vaginal infection. They should see their health care provider and be treated if they have problems, such as a mild rash or itching.  Practice safe sex by using condoms and only having one sex partner. SEEK MEDICAL CARE IF:   Your symptoms are not improving after 3 days of treatment.  You have increased discharge or pain.  You have a fever. MAKE SURE YOU:   Understand these instructions.  Will watch your condition.  Will get help right away if you are not doing well or get worse. FOR MORE INFORMATION  Centers for Disease Control and Prevention, Division of STD Prevention: SolutionApps.co.zawww.cdc.gov/std American Sexual Health Association (ASHA): www.ashastd.org  Document Released: 05/11/2005 Document Revised: 03/01/2013 Document Reviewed: 12/21/2012 [facility] Patient Information 2015 [place], [place]. This information is not intended to replace advice given to you by your health care provider. Make sure you discuss any questions you have with your health care provider. Abdominal Pain During Pregnancy Abdominal pain is common in pregnancy. Most of the time, it does not cause harm. There are many causes of abdominal pain. Some causes are more serious than others. Some of the causes of abdominal pain in pregnancy are easily diagnosed. Occasionally, the diagnosis takes time to understand. Other times, the cause is not determined. Abdominal pain can be a sign that something is very wrong with the pregnancy, or the pain may have nothing to do with the pregnancy at  all. For this reason, always tell your health care provider if you have any abdominal discomfort. HOME CARE INSTRUCTIONS  Monitor your abdominal pain for any changes. The following actions may help to  alleviate any discomfort you are experiencing:  Do not have sexual intercourse or put anything in your vagina until your symptoms go away completely.  Get plenty of rest until your pain improves.  Drink clear fluids if you feel nauseous. Avoid solid food as long as you are uncomfortable or nauseous.  Only take over-the-counter or prescription medicine as directed by your health care provider.  Keep all follow-up appointments with your health care provider. SEEK IMMEDIATE MEDICAL CARE IF:  You are bleeding, leaking fluid, or passing tissue from the vagina.  You have increasing pain or cramping.  You have persistent vomiting.  You have painful or bloody urination.  You have a fever.  You notice a decrease in your baby's movements.  You have extreme weakness or feel faint.  You have shortness of breath, with or without abdominal pain.  You develop a severe headache with abdominal pain.  You have abnormal vaginal discharge with abdominal pain.  You have persistent diarrhea.  You have abdominal pain that continues even after rest, or gets worse. MAKE SURE YOU:   Understand these instructions.  Will watch your condition.  Will get help right away if you are not doing well or get worse. Document Released: 05/11/2005 Document Revised: 03/01/2013 Document Reviewed: 12/08/2012 [facility] Patient Information 2015 [place]Care, [place]. This information is not intended to replace advice given to you by your health care provider. Make sure you discuss any questions you have with your health care provider. Pelvic Rest Pelvic rest is sometimes recommended for women when:   The placenta is partially or completely covering the opening of the cervix (placenta previa).  There is bleeding between the uterine wall and the amniotic sac in the first trimester (subchorionic hemorrhage).  The cervix begins to open without labor starting (incompetent cervix, cervical insufficiency).  The labor is  too early (preterm labor). HOME CARE INSTRUCTIONS  Do not have sexual intercourse, stimulation, or an orgasm.  Do not use tampons, douche, or put anything in the vagina.  Do not lift anything over 10 pounds (4.5 kg).  Avoid strenuous activity or straining your pelvic muscles. SEEK MEDICAL CARE IF:  You have any vaginal bleeding during pregnancy. Treat this as a potential emergency.  You have cramping pain felt low in the stomach (stronger than menstrual cramps).  You notice vaginal discharge (watery, mucus, or bloody).  You have a low, dull backache.  There are regular contractions or uterine tightening. SEEK IMMEDIATE MEDICAL CARE IF: You have vaginal bleeding and have placenta previa.  Document Released: 09/05/2010 Document Revised: 08/03/2011 Document Reviewed: 09/05/2010 [facility] Patient Information 2015 [place], [place]. This information is not intended to replace advice given to you by your health care provider. Make sure you discuss any questions you have with your health care provider.

## 2013-11-12 NOTE — MAU Note (Signed)
Pt presents to MAU with complaints of cramping in her lower abdomen that started this morning. She reports that she was evaluated by Dr Ernestina PennaFogleman Tuesday for the same problem. Denies any vaginal bleeding or LOF

## 2013-11-13 LAB — CULTURE, OB URINE
COLONY COUNT: NO GROWTH
Culture: NO GROWTH
Special Requests: NORMAL

## 2013-11-16 ENCOUNTER — Encounter (HOSPITAL_COMMUNITY): Payer: Self-pay

## 2013-11-16 ENCOUNTER — Ambulatory Visit (HOSPITAL_COMMUNITY)
Admission: RE | Admit: 2013-11-16 | Discharge: 2013-11-16 | Disposition: A | Discharge status: Home / Self Care | Payer: BC Managed Care – PPO | Source: Ambulatory Visit | Attending: Obstetrics | Admitting: Obstetrics

## 2013-11-16 DIAGNOSIS — O289 Unspecified abnormal findings on antenatal screening of mother: Secondary | ICD-10-CM | POA: Insufficient documentation

## 2013-11-16 DIAGNOSIS — Z3689 Encounter for other specified antenatal screening: Secondary | ICD-10-CM | POA: Insufficient documentation

## 2013-11-16 DIAGNOSIS — O283 Abnormal ultrasonic finding on antenatal screening of mother: Secondary | ICD-10-CM

## 2013-11-16 HISTORY — DX: Unspecified abnormal findings on antenatal screening of mother: O28.9

## 2013-12-12 ENCOUNTER — Other Ambulatory Visit (HOSPITAL_COMMUNITY): Payer: Self-pay | Admitting: Obstetrics

## 2013-12-12 DIAGNOSIS — O3421 Maternal care for scar from previous cesarean delivery: Secondary | ICD-10-CM

## 2013-12-12 DIAGNOSIS — O09529 Supervision of elderly multigravida, unspecified trimester: Secondary | ICD-10-CM

## 2013-12-12 DIAGNOSIS — O289 Unspecified abnormal findings on antenatal screening of mother: Secondary | ICD-10-CM

## 2013-12-18 ENCOUNTER — Telehealth (INDEPENDENT_AMBULATORY_CARE_PROVIDER_SITE_OTHER): Payer: Self-pay

## 2013-12-18 ENCOUNTER — Encounter (INDEPENDENT_AMBULATORY_CARE_PROVIDER_SITE_OTHER): Payer: Self-pay | Admitting: General Surgery

## 2013-12-18 ENCOUNTER — Ambulatory Visit (INDEPENDENT_AMBULATORY_CARE_PROVIDER_SITE_OTHER): Payer: BC Managed Care – PPO | Admitting: General Surgery

## 2013-12-18 VITALS — BP 110/70 | HR 80 | Temp 97.5°F | Resp 16 | Ht 62.0 in | Wt 193.4 lb

## 2013-12-18 DIAGNOSIS — K420 Umbilical hernia with obstruction, without gangrene: Secondary | ICD-10-CM

## 2013-12-18 HISTORY — DX: Umbilical hernia with obstruction, without gangrene: K42.0

## 2013-12-18 NOTE — Telephone Encounter (Signed)
Called and left message for patient to call our office RE:  Patient appointment needs to be moved up if possible to arrive at 3:15 pm for a 3:45pm appointment w/[facility].

## 2013-12-18 NOTE — Progress Notes (Signed)
Patient ID: Mallory Martin, female   DOB: 11/28/1975, 38 y.o.   MRN: [id number]  No chief complaint on file.   HPI Mallory Martin is a 38 y.o. female.  She is referred by Dr. Lisbeth RenshawKelley Fogelman for an umbilical hernia.  This patient is [redacted] weeks pregnant with her second pregnancy. She has noticed an uncomfortable bulge around her umbilicus for about 6 weeks. Especially when  she sneezes. It has not been hurting too bad the last few weeks. She has never had any abdominal surgery other than one cesarean section.  G2P1.  Comorbidities are minimal. She has allergies. BMI 35. Married. One child, a son who is here with her today. 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 HPI  Past Medical History  Diagnosis Date  . Asthma     Past Surgical History  Procedure Laterality Date  . Cesarean section    . Wisdom tooth extraction      No family history on file.  Social History History  Substance Use Topics  . Smoking status: Never Smoker   . Smokeless tobacco: Not on file  . Alcohol Use: No    Allergies  Allergen Reactions  . Advil [Ibuprofen] Swelling  . Excedrin Extra Strength [Aspirin-Acetaminophen-Caffeine] Swelling    Excedrin Migraine  . Oxycodone Nausea Only  . Vicodin [Hydrocodone-Acetaminophen] Nausea Only    Current Outpatient Prescriptions  Medication Sig Dispense Refill  . naproxen (NAPROSYN) 500 MG tablet Take 500 mg by mouth.      [name]. acetaminophen (TYLENOL) 500 MG tablet Take 250 mg by mouth every 6 (six) hours as needed for moderate pain.      . diphenhydrAMINE (BENADRYL) 12.5 MG chewable tablet Chew 37.5 mg by mouth 4 (four) times daily as needed. For allergies.      . diphenhydramine-acetaminophen (TYLENOL PM) 25-500 MG TABS Take 1 tablet by mouth at bedtime as needed. sleep      . Fexofenadine-Pseudoephedrine (ALLEGRA-D PO) Take 1 tablet by mouth daily as needed (allergies).      . MetroNIDAZOLE (FLAGYL PO) Take 1 tablet by mouth 2 (two)  times daily. 7 day antibiotic for bacterial infection. Approximately 2 days left.      . Prenatal Vit-Fe Fumarate-FA (PRENATAL MULTIVITAMIN) TABS tablet Take 1 tablet by mouth daily at 12 noon.       No current facility-administered medications for this visit.    Review of Systems Review of Systems  Constitutional: Negative for fever, chills and unexpected weight change.  HENT: Negative for congestion, hearing loss, sore throat, trouble swallowing and voice change.   Eyes: Negative for visual disturbance.  Respiratory: Negative for cough and wheezing.   Cardiovascular: Negative for chest pain, palpitations and leg swelling.  Gastrointestinal: Positive for abdominal pain. Negative for nausea, vomiting, diarrhea, constipation, blood in stool, abdominal distention and anal bleeding.  Genitourinary: Negative for hematuria, vaginal bleeding and difficulty urinating.  Musculoskeletal: Negative for arthralgias.  Skin: Negative for rash and wound.  Neurological: Negative for seizures, syncope and headaches.  Hematological: Negative for adenopathy. Does not bruise/bleed easily.  Psychiatric/Behavioral: Negative for confusion.    Blood pressure 110/70, pulse 80, temperature 97.5 F (36.4 C), temperature source Temporal, resp. rate 16, height 5\' 2"  (1.575 m), weight 193 lb 6.4 oz (87.726 kg), last menstrual period 06/18/2013.  Physical Exam Physical Exam  Constitutional: She is oriented to person, place, and time. She appears well-developed and well-nourished. No distress.  HENT:  Head: Normocephalic and  atraumatic.  Nose: Nose normal.  Mouth/Throat: No oropharyngeal exudate.  Eyes: Conjunctivae and EOM are normal. Pupils are equal, round, and reactive to light. Left eye exhibits no discharge. No scleral icterus.  Neck: Neck supple. No JVD present. No tracheal deviation present. No thyromegaly present.  Cardiovascular: Normal rate, regular rhythm, normal heart sounds and intact distal pulses.    No murmur heard. Pulmonary/Chest: Effort normal and breath sounds normal. No respiratory distress. She has no wheezes. She has no rales. She exhibits no tenderness.  Abdominal: Soft. Bowel sounds are normal. She exhibits no distension and no mass. There is no tenderness. There is no rebound and no guarding.  Gravid uterus, palpable just above the umbilicus. Examined supine and standing. She does have a partially incarcerated umbilical hernia with the incarcerated omentum just above the umbilicus. She also seems to have a diastasis recti. I do not think there is any intestine trapped in the hernia since it is so small. The skin is healthy.  Musculoskeletal: She exhibits no edema and no tenderness.  Lymphadenopathy:    She has no cervical adenopathy.  Neurological: She is alert and oriented to person, place, and time. She exhibits normal muscle tone. Coordination normal.  Skin: Skin is warm. No rash noted. She is not diaphoretic. No erythema. No pallor.  Psychiatric: She has a normal mood and affect. Her behavior is normal. Judgment and thought content normal.    Data Reviewed Office notes from whenever OB/GYN  Assessment    Small, Incarcerated umbilical hernia, without gangrene. Minimally symptomatic  [redacted] weeks pregnant  History cesarean section  Seasonal allergies     Plan    I advised her to avoid surgical intervention of the umbilical hernia during her pregnancy, and she is in full agreement.  I told her that ideally we would repair the hernia electively  with mesh postpartum. I told her that it would not be a good idea to repair the hernia at the time of her cesarean section due to the risk of bacterial contamination  She'll return to see me about 10 weeks postpartum for reevaluation and decision regarding elective repair of her umbilical hernia with mesh.  She was given patient education booklets. I drew diagrams and discussed this with her in detail.        Yehudit Fulginiti  M 12/18/2013, 3:44 PM

## 2013-12-18 NOTE — Patient Instructions (Signed)
You have a small, partially incarcerated umbilical hernia.  You are [redacted] weeks pregnant.  There is no immediate indication for urgent or emergent surgery to repair the hernia.  I recommend that you continue with your pregnancy and return to see me about 10 weeks postpartum.  We will be happy to reevaluate you have any time if the hernia becomes painful or gets larger.    Umbilical Herniorrhaphy Herniorrhaphy is surgery to repair a hernia. A hernia is the protrusion of a part of an organ through an abdominal opening. An umbilical hernia means that your hernia is in the area around your navel. If the hernia is not repaired, the gap could get bigger. Your intestines or other tissues, such as fat, could get trapped in the gap. This can lead to other health problems, such as blocked intestines. If the hernia is fixed before problems set in, you may be allowed to go home the same day as the surgery (outpatient). LET [facility]OUR [facility]ALTH CARE PROVIDER KNOW ABOUT:  Allergies to food or medicine.  Medicines taken, including vitamins, herbs, eye drops, over-the-counter medicines, and creams.  Use of steroids (by mouth or creams).  Previous problems with anesthetics or numbing medicines.  History of bleeding problems or blood clots.  Previous surgery.  Other health problems, including diabetes and kidney problems.  Possibility of pregnancy, if this applies. RISKS AND COMPLICATIONS  Pain.  Excessive bleeding.  Hematoma. This is a pocket of blood that collects under the surgery site.  Infection at the surgery site.  Numbness at the surgery site.  Swelling and bruising.  Blood clots.  Intestinal damage (rare).  Scarring.  Skin damage.  Development of another hernia. This may require another surgery. BEFORE THE PROCEDURE  Ask your health care provider about changing or stopping your regular medicines. You may need to stop taking aspirin, nonsteroidal anti-inflammatory drugs (NSAIDs),  vitamin E, and blood thinners as early as 2 weeks before the procedure.  Do not eat or drink for 8 hours before the procedure, or as directed by your health care provider.  You might be asked to shower or wash with an antibacterial soap before the procedure.  Wear comfortable clothes that will be easy to put on after the procedure. PROCEDURE You will be given an intravenous (IV) tube. A needle will be inserted in your arm. Medicine will flow directly into your body through this needle. You might be given medicine to help you relax (sedative). You will be given medicine that numbs the area (local anesthetic) or medicine that makes you sleep (general anesthetic). If you have open surgery:  The surgeon will make a cut (incision) in your abdomen.  The gap in the muscle wall will be repaired. The surgeon may sew the edges together over the gap or use a mesh material to strengthen the area. When mesh is used, the body grows new, strong tissue into and around it. This new tissue closes the gap.  A drain might be put in to remove excess fluid from the body after surgery.  The surgeon will close the incision with stitches, glue, or staples. If you have laparoscopic surgery:  The surgeon will make several small incisions in your abdomen.  A thin, lighted tube (laparoscope) will be inserted into the abdomen through an incision. A camera is attached to the laparoscope that allows the surgeon to see inside the abdomen.  Tools will be inserted through the other incisions to repair the hernia. Usually, mesh is used to cover  the gap.  The surgeon will close the incisions with stitches. AFTER THE PROCEDURE  You will be taken to a recovery area. A nurse will watch and check your progress.  When you are awake, feeling well, and taking fluids well, you may be allowed to go home. In some cases, you may need to stay overnight in the hospital.  Arrange for someone to drive you home. Document Released:  08/07/2008 Document Revised: 09/25/2013 Document Reviewed: 08/12/2011 [facility] Patient Information 2015 [place], [place]. This information is not intended to replace advice given to you by your health care provider. Make sure you discuss any questions you have with your health care provider.

## 2013-12-28 ENCOUNTER — Ambulatory Visit (HOSPITAL_COMMUNITY)
Admission: RE | Admit: 2013-12-28 | Discharge: 2013-12-28 | Disposition: A | Discharge status: Home / Self Care | Payer: BC Managed Care – PPO | Source: Ambulatory Visit | Attending: Obstetrics | Admitting: Obstetrics

## 2013-12-28 ENCOUNTER — Encounter (HOSPITAL_COMMUNITY): Payer: Self-pay

## 2013-12-28 VITALS — BP 98/48 | HR 85 | Wt 193.0 lb

## 2013-12-28 DIAGNOSIS — O3421 Maternal care for scar from previous cesarean delivery: Secondary | ICD-10-CM

## 2013-12-28 DIAGNOSIS — Z3689 Encounter for other specified antenatal screening: Secondary | ICD-10-CM | POA: Insufficient documentation

## 2013-12-28 DIAGNOSIS — O289 Unspecified abnormal findings on antenatal screening of mother: Secondary | ICD-10-CM | POA: Diagnosis not present

## 2013-12-28 DIAGNOSIS — O34219 Maternal care for unspecified type scar from previous cesarean delivery: Secondary | ICD-10-CM | POA: Diagnosis not present

## 2013-12-28 DIAGNOSIS — O09529 Supervision of elderly multigravida, unspecified trimester: Secondary | ICD-10-CM | POA: Insufficient documentation

## 2014-01-25 ENCOUNTER — Ambulatory Visit (HOSPITAL_COMMUNITY): Payer: BC Managed Care – PPO

## 2014-02-15 ENCOUNTER — Other Ambulatory Visit: Payer: Self-pay | Admitting: Obstetrics

## 2014-03-16 ENCOUNTER — Encounter (HOSPITAL_COMMUNITY): Payer: Self-pay

## 2014-03-16 NOTE — Patient Instructions (Signed)
   Your procedure is scheduled on:  Wednesday, Oct 28  [facility] at: 1 PM Pick up the phone at the desk and dial [phone] and inform us of your arrival.  Please call this number if you have any problems the morning of surgery: [phone]  Remember: Do not eat food after midnight: Tuesday Do not drink clear liquids after: 10:30 AM Wednesday, day of surgery Take these medicines the morning of surgery with a SIP OF WATER:  Do not wear jewelry, make-up, or FINGER nail polish No metal in your hair or on your body. Do not wear lotions, powders, perfumes.  You may wear deodorant.  Do not bring valuables to the hospital. Contacts, dentures or bridgework may not be worn into surgery.  Leave suitcase in the car. After Surgery it may be brought to your room. For patients being admitted to the hospital, checkout time is 11:00am the day of discharge.

## 2014-03-17 ENCOUNTER — Encounter (HOSPITAL_COMMUNITY): Payer: BC Managed Care – PPO | Admitting: Anesthesiology

## 2014-03-17 ENCOUNTER — Inpatient Hospital Stay (HOSPITAL_COMMUNITY): Payer: BC Managed Care – PPO | Admitting: Anesthesiology

## 2014-03-17 ENCOUNTER — Encounter (HOSPITAL_COMMUNITY): Payer: Self-pay | Admitting: Anesthesiology

## 2014-03-17 ENCOUNTER — Inpatient Hospital Stay (HOSPITAL_COMMUNITY)
Admission: AD | Admit: 2014-03-17 | Discharge: 2014-03-20 | DRG: 765 | Disposition: A | Discharge status: Home / Self Care | Payer: BC Managed Care – PPO | Source: Ambulatory Visit | Attending: Obstetrics | Admitting: Obstetrics

## 2014-03-17 ENCOUNTER — Encounter (HOSPITAL_COMMUNITY)
Admission: AD | Disposition: A | Discharge status: Home / Self Care | Payer: Self-pay | Source: Ambulatory Visit | Attending: Obstetrics

## 2014-03-17 DIAGNOSIS — O9902 Anemia complicating childbirth: Secondary | ICD-10-CM | POA: Diagnosis present

## 2014-03-17 DIAGNOSIS — O358XX0 Maternal care for other (suspected) fetal abnormality and damage, not applicable or unspecified: Secondary | ICD-10-CM | POA: Diagnosis present

## 2014-03-17 DIAGNOSIS — K429 Umbilical hernia without obstruction or gangrene: Secondary | ICD-10-CM | POA: Diagnosis present

## 2014-03-17 DIAGNOSIS — O09523 Supervision of elderly multigravida, third trimester: Secondary | ICD-10-CM | POA: Diagnosis not present

## 2014-03-17 DIAGNOSIS — O3421 Maternal care for scar from previous cesarean delivery: Secondary | ICD-10-CM | POA: Diagnosis present

## 2014-03-17 DIAGNOSIS — O9989 Other specified diseases and conditions complicating pregnancy, childbirth and the puerperium: Secondary | ICD-10-CM | POA: Diagnosis present

## 2014-03-17 DIAGNOSIS — Z3A38 38 weeks gestation of pregnancy: Secondary | ICD-10-CM | POA: Diagnosis present

## 2014-03-17 DIAGNOSIS — D62 Acute posthemorrhagic anemia: Secondary | ICD-10-CM | POA: Diagnosis present

## 2014-03-17 DIAGNOSIS — O99824 Streptococcus B carrier state complicating childbirth: Secondary | ICD-10-CM | POA: Diagnosis present

## 2014-03-17 DIAGNOSIS — O9279 Other disorders of lactation: Secondary | ICD-10-CM

## 2014-03-17 LAB — CBC
HCT: 35.1 % — ABNORMAL LOW (ref 36.0–46.0)
Hemoglobin: 11.8 g/dL — ABNORMAL LOW (ref 12.0–15.0)
MCH: 28.4 pg (ref 26.0–34.0)
MCHC: 33.6 g/dL (ref 30.0–36.0)
MCV: 84.4 fL (ref 78.0–100.0)
Platelets: 246 10*3/uL (ref 150–400)
RBC: 4.16 MIL/uL (ref 3.87–5.11)
RDW: 13 % (ref 11.5–15.5)
WBC: 9.2 10*3/uL (ref 4.0–10.5)

## 2014-03-17 LAB — TYPE AND SCREEN
ABO/RH(D): A POS
Antibody Screen: NEGATIVE

## 2014-03-17 LAB — RPR

## 2014-03-17 LAB — ABO/RH: ABO/RH(D): A POS

## 2014-03-17 SURGERY — Surgical Case
Anesthesia: Spinal | Site: Abdomen

## 2014-03-17 MED ORDER — LACTATED RINGERS IV SOLN
INTRAVENOUS | Status: DC
  Administered 2014-03-17 (×2): via INTRAVENOUS

## 2014-03-17 MED ORDER — DIPHENHYDRAMINE HCL 25 MG PO CAPS: 25.0000 mg | ORAL_CAPSULE | Freq: Four times a day (QID) | ORAL | Status: DC | PRN

## 2014-03-17 MED ORDER — ONDANSETRON HCL 4 MG/2ML IJ SOLN: 4.0000 mg | Freq: Three times a day (TID) | INTRAMUSCULAR | Status: DC | PRN

## 2014-03-17 MED ORDER — PHENYLEPHRINE 8 MG IN D5W 100 ML (0.08MG/ML) PREMIX OPTIME
INJECTION | INTRAVENOUS | Status: AC
  Filled 2014-03-17: qty 100

## 2014-03-17 MED ORDER — CEFAZOLIN SODIUM-DEXTROSE 2-3 GM-% IV SOLR
2.0000 g | INTRAVENOUS | Status: AC
  Administered 2014-03-17: 2 g via INTRAVENOUS
  Filled 2014-03-17: qty 50

## 2014-03-17 MED ORDER — TETANUS-DIPHTH-ACELL PERTUSSIS 5-2.5-18.5 LF-MCG/0.5 IM SUSP: 0.5000 mL | Freq: Once | INTRAMUSCULAR | Status: DC

## 2014-03-17 MED ORDER — LANOLIN HYDROUS EX OINT: 1.0000 "application " | TOPICAL_OINTMENT | CUTANEOUS | Status: DC | PRN

## 2014-03-17 MED ORDER — NALOXONE HCL 1 MG/ML IJ SOLN
1.0000 ug/kg/h | INTRAVENOUS | Status: DC | PRN
  Filled 2014-03-17: qty 2

## 2014-03-17 MED ORDER — SIMETHICONE 80 MG PO CHEW: 80.0000 mg | CHEWABLE_TABLET | ORAL | Status: DC | PRN

## 2014-03-17 MED ORDER — FENTANYL CITRATE 0.05 MG/ML IJ SOLN
INTRAMUSCULAR | Status: AC
  Filled 2014-03-17: qty 2

## 2014-03-17 MED ORDER — PRENATAL MULTIVITAMIN CH
1.0000 | ORAL_TABLET | Freq: Every day | ORAL | Status: DC
  Administered 2014-03-18 – 2014-03-20 (×3): 1 via ORAL
  Filled 2014-03-17 (×3): qty 1

## 2014-03-17 MED ORDER — OXYCODONE-ACETAMINOPHEN 5-325 MG PO TABS
1.0000 | ORAL_TABLET | ORAL | Status: DC | PRN
  Administered 2014-03-17 – 2014-03-18 (×2): 1 via ORAL
  Filled 2014-03-17 (×5): qty 1

## 2014-03-17 MED ORDER — SCOPOLAMINE 1 MG/3DAYS TD PT72
1.0000 | MEDICATED_PATCH | Freq: Once | TRANSDERMAL | Status: DC
  Filled 2014-03-17: qty 1

## 2014-03-17 MED ORDER — OXYTOCIN 10 UNIT/ML IJ SOLN
INTRAMUSCULAR | Status: AC
  Filled 2014-03-17: qty 4

## 2014-03-17 MED ORDER — MORPHINE SULFATE 0.5 MG/ML IJ SOLN
INTRAMUSCULAR | Status: AC
  Filled 2014-03-17: qty 10

## 2014-03-17 MED ORDER — SENNOSIDES-DOCUSATE SODIUM 8.6-50 MG PO TABS
2.0000 | ORAL_TABLET | ORAL | Status: DC
  Administered 2014-03-18 – 2014-03-19 (×3): 2 via ORAL
  Filled 2014-03-17 (×3): qty 2

## 2014-03-17 MED ORDER — NALBUPHINE HCL 10 MG/ML IJ SOLN: 5.0000 mg | Freq: Once | INTRAMUSCULAR | Status: AC | PRN

## 2014-03-17 MED ORDER — PHENYLEPHRINE 8 MG IN D5W 100 ML (0.08MG/ML) PREMIX OPTIME
INJECTION | INTRAVENOUS | Status: DC | PRN
  Administered 2014-03-17: 60 ug/min via INTRAVENOUS

## 2014-03-17 MED ORDER — NALOXONE HCL 0.4 MG/ML IJ SOLN: 0.4000 mg | INTRAMUSCULAR | Status: DC | PRN

## 2014-03-17 MED ORDER — OXYCODONE-ACETAMINOPHEN 5-325 MG PO TABS
2.0000 | ORAL_TABLET | ORAL | Status: DC | PRN
  Administered 2014-03-18 – 2014-03-20 (×13): 2 via ORAL
  Filled 2014-03-17 (×12): qty 2

## 2014-03-17 MED ORDER — SODIUM CHLORIDE 0.9 % IJ SOLN: 3.0000 mL | INTRAMUSCULAR | Status: DC | PRN

## 2014-03-17 MED ORDER — DIPHENHYDRAMINE HCL 50 MG/ML IJ SOLN: 12.5000 mg | INTRAMUSCULAR | Status: DC | PRN

## 2014-03-17 MED ORDER — FENTANYL CITRATE 0.05 MG/ML IJ SOLN
25.0000 ug | INTRAMUSCULAR | Status: DC | PRN
  Administered 2014-03-17: 50 ug via INTRAVENOUS

## 2014-03-17 MED ORDER — ONDANSETRON HCL 4 MG PO TABS: 4.0000 mg | ORAL_TABLET | ORAL | Status: DC | PRN

## 2014-03-17 MED ORDER — SIMETHICONE 80 MG PO CHEW
80.0000 mg | CHEWABLE_TABLET | ORAL | Status: DC
  Administered 2014-03-18 – 2014-03-19 (×3): 80 mg via ORAL
  Filled 2014-03-17 (×4): qty 1

## 2014-03-17 MED ORDER — ONDANSETRON HCL 4 MG/2ML IJ SOLN
INTRAMUSCULAR | Status: AC
  Filled 2014-03-17: qty 2

## 2014-03-17 MED ORDER — DIBUCAINE 1 % RE OINT: 1.0000 "application " | TOPICAL_OINTMENT | RECTAL | Status: DC | PRN

## 2014-03-17 MED ORDER — HYDROCORTISONE 1 % EX CREA
TOPICAL_CREAM | Freq: Three times a day (TID) | CUTANEOUS | Status: DC
  Administered 2014-03-18 – 2014-03-20 (×3): via TOPICAL
  Filled 2014-03-17: qty 28

## 2014-03-17 MED ORDER — MORPHINE SULFATE (PF) 0.5 MG/ML IJ SOLN
INTRAMUSCULAR | Status: DC | PRN
  Administered 2014-03-17: .2 mg via INTRATHECAL

## 2014-03-17 MED ORDER — ZOLPIDEM TARTRATE 5 MG PO TABS: 5.0000 mg | ORAL_TABLET | Freq: Every evening | ORAL | Status: DC | PRN

## 2014-03-17 MED ORDER — OXYTOCIN 40 UNITS IN LACTATED RINGERS INFUSION - SIMPLE MED: 62.5000 mL/h | INTRAVENOUS | Status: AC

## 2014-03-17 MED ORDER — DIPHENHYDRAMINE HCL 25 MG PO CAPS: 25.0000 mg | ORAL_CAPSULE | ORAL | Status: DC | PRN

## 2014-03-17 MED ORDER — CITRIC ACID-SODIUM CITRATE 334-500 MG/5ML PO SOLN
30.0000 mL | Freq: Once | ORAL | Status: AC
  Administered 2014-03-17: 30 mL via ORAL
  Filled 2014-03-17: qty 15

## 2014-03-17 MED ORDER — KETOROLAC TROMETHAMINE 30 MG/ML IJ SOLN
INTRAMUSCULAR | Status: AC
  Filled 2014-03-17: qty 1

## 2014-03-17 MED ORDER — OXYTOCIN 10 UNIT/ML IJ SOLN
40.0000 [IU] | INTRAMUSCULAR | Status: DC | PRN
  Administered 2014-03-17: 40 [IU] via INTRAVENOUS

## 2014-03-17 MED ORDER — SIMETHICONE 80 MG PO CHEW
80.0000 mg | CHEWABLE_TABLET | Freq: Three times a day (TID) | ORAL | Status: DC
  Administered 2014-03-18 – 2014-03-20 (×6): 80 mg via ORAL
  Filled 2014-03-17 (×6): qty 1

## 2014-03-17 MED ORDER — MENTHOL 3 MG MT LOZG: 1.0000 | LOZENGE | OROMUCOSAL | Status: DC | PRN

## 2014-03-17 MED ORDER — LACTATED RINGERS IV SOLN
INTRAVENOUS | Status: DC
  Administered 2014-03-17: 20:00:00 via INTRAVENOUS

## 2014-03-17 MED ORDER — BUPIVACAINE IN DEXTROSE 0.75-8.25 % IT SOLN
INTRATHECAL | Status: DC | PRN
  Administered 2014-03-17: 12 mg via INTRATHECAL

## 2014-03-17 MED ORDER — WITCH HAZEL-GLYCERIN EX PADS: 1.0000 "application " | MEDICATED_PAD | CUTANEOUS | Status: DC | PRN

## 2014-03-17 MED ORDER — ONDANSETRON HCL 4 MG/2ML IJ SOLN
4.0000 mg | INTRAMUSCULAR | Status: DC | PRN
  Administered 2014-03-17: 4 mg via INTRAVENOUS
  Filled 2014-03-17: qty 2

## 2014-03-17 MED ORDER — NALBUPHINE HCL 10 MG/ML IJ SOLN: 5.0000 mg | INTRAMUSCULAR | Status: DC | PRN

## 2014-03-17 MED ORDER — ONDANSETRON HCL 4 MG/2ML IJ SOLN
INTRAMUSCULAR | Status: DC | PRN
  Administered 2014-03-17: 4 mg via INTRAVENOUS

## 2014-03-17 MED ORDER — FENTANYL CITRATE 0.05 MG/ML IJ SOLN
INTRAMUSCULAR | Status: DC | PRN
  Administered 2014-03-17: 25 ug via INTRATHECAL

## 2014-03-17 MED ORDER — MEPERIDINE HCL 25 MG/ML IJ SOLN: 6.2500 mg | INTRAMUSCULAR | Status: DC | PRN

## 2014-03-17 SURGICAL SUPPLY — 38 items
CLAMP CORD UMBIL (MISCELLANEOUS) IMPLANT
CLOSURE WOUND 1/2 X4 (GAUZE/BANDAGES/DRESSINGS)
CLOTH BEACON ORANGE TIMEOUT ST (SAFETY) ×3 IMPLANT
CONTAINER PREFILL 10% NBF 15ML (MISCELLANEOUS) IMPLANT
DRAPE SHEET LG 3/4 BI-LAMINATE (DRAPES) IMPLANT
DRSG OPSITE POSTOP 4X10 (GAUZE/BANDAGES/DRESSINGS) ×3 IMPLANT
DURAPREP 26ML APPLICATOR (WOUND CARE) ×3 IMPLANT
ELECT REM PT RETURN 9FT ADLT (ELECTROSURGICAL) ×3
ELECTRODE REM PT RTRN 9FT ADLT (ELECTROSURGICAL) ×1 IMPLANT
EXTRACTOR VACUUM M CUP 4 TUBE (SUCTIONS) IMPLANT
EXTRACTOR VACUUM M CUP 4' TUBE (SUCTIONS)
GLOVE BIO SURGEON STRL SZ 6.5 (GLOVE) ×2 IMPLANT
GLOVE BIO SURGEONS STRL SZ 6.5 (GLOVE) ×1
GLOVE BIOGEL PI IND STRL 7.0 (GLOVE) ×1 IMPLANT
GLOVE BIOGEL PI INDICATOR 7.0 (GLOVE) ×2
GOWN STRL REUS W/TWL LRG LVL3 (GOWN DISPOSABLE) ×6 IMPLANT
KIT ABG SYR 3ML LUER SLIP (SYRINGE) IMPLANT
NDL HYPO 25X5/8 SAFETYGLIDE (NEEDLE) IMPLANT
NEEDLE HYPO 25X5/8 SAFETYGLIDE (NEEDLE) IMPLANT
NS IRRIG 1000ML POUR BTL (IV SOLUTION) ×3 IMPLANT
PACK C SECTION WH (CUSTOM PROCEDURE TRAY) ×3 IMPLANT
PAD OB MATERNITY 4.3X12.25 (PERSONAL CARE ITEMS) ×3 IMPLANT
STAPLER VISISTAT 35W (STAPLE) IMPLANT
STRIP CLOSURE SKIN 1/2X4 (GAUZE/BANDAGES/DRESSINGS) IMPLANT
SUT MNCRL AB 4-0 PS2 18 (SUTURE) ×2 IMPLANT
SUT MON AB 4-0 PS1 27 (SUTURE) IMPLANT
SUT PLAIN 0 NONE (SUTURE) IMPLANT
SUT PLAIN 2 0 (SUTURE) ×3
SUT PLAIN 2 0 XLH (SUTURE) IMPLANT
SUT PLAIN ABS 2-0 CT1 27XMFL (SUTURE) IMPLANT
SUT VIC AB 0 CT1 36 (SUTURE) ×6 IMPLANT
SUT VIC AB 0 CTX 36 (SUTURE) ×6
SUT VIC AB 0 CTX36XBRD ANBCTRL (SUTURE) ×2 IMPLANT
SUT VIC AB 2-0 CT1 27 (SUTURE) ×3
SUT VIC AB 2-0 CT1 TAPERPNT 27 (SUTURE) ×1 IMPLANT
TOWEL OR 17X24 6PK STRL BLUE (TOWEL DISPOSABLE) ×3 IMPLANT
TRAY FOLEY CATH 14FR (SET/KITS/TRAYS/PACK) IMPLANT
WATER STERILE IRR 1000ML POUR (IV SOLUTION) ×3 IMPLANT

## 2014-03-17 NOTE — Transfer of Care (Signed)
Immediate Anesthesia Transfer of Care Note  Patient: Mallory Martin  Procedure(s) Performed: Procedure(s) with comments: CESAREAN SECTION (N/A) - repeat c-section  Patient Location: PACU  Anesthesia Type:Spinal  Level of Consciousness: awake and alert   Airway & Oxygen Therapy: Patient Spontanous Breathing  Post-op Assessment: Report given to PACU RN and Post -op Vital signs reviewed and stable  Post vital signs: Reviewed and stable  Complications: No apparent anesthesia complications

## 2014-03-17 NOTE — Brief Op Note (Signed)
03/17/2014  7:47 AM  PATIENT:  Mallory Martin  38 y.o. female  PRE-OPERATIVE DIAGNOSIS:  Previous Cesarean Section Term Spontaneous Rupture of Membranes  POST-OPERATIVE DIAGNOSIS:  Previous Cesarean Section Term Spontaneous Rupture of Membranes  PROCEDURE:  Procedure(s) with comments: CESAREAN SECTION (N/A) - repeat c-section LTCS, 2 layer closure  SURGEON:  Surgeon(s) and Role:    * Sachin Ferencz A. Ernestina PennaFogleman, MD - Primary  PHYSICIAN ASSISTANT:   ASSISTANTS: Fredric MareBailey, CNM   ANESTHESIA:   spinal  EBL:  Total I/O In: -  Out: 900 [Urine:400; Blood:500]  BLOOD ADMINISTERED:none  DRAINS: Urinary Catheter (Foley)   LOCAL MEDICATIONS USED:  NONE  SPECIMEN:  Source of Specimen:  placenta, cord pH  DISPOSITION OF SPECIMEN:  L&D  COUNTS:  YES  TOURNIQUET:  * No tourniquets in log *  DICTATION: .Note written in EPIC  PLAN OF CARE: Admit to inpatient   PATIENT DISPOSITION:  PACU - hemodynamically stable.   Delay start of Pharmacological VTE agent (>24hrs) due to surgical blood loss or risk of bleeding: yes

## 2014-03-17 NOTE — H&P (Signed)
Lennox GrumblesFioanna S Posey is a 38 y.o. G2P1001 at 5686w6d presenting for ROM. Pt notes onset contractions about 2 hrs ago, every 5-10 min . Good fetal movement, No vaginal bleeding, started  leaking fluid at 4 am.  [facility] - Umbilical hernia, significant pain issues during preg, general surgery to repair PP - fetal chromosomal abnl- Genetic screening abnormal NT scan with 1:5 risk down syndrome, informaseq with normal T13,18,21 but XXX, unclear if XXX mosaic. S/p MFM counseling, furthe testing declined. Anatomy US cystic hygroma, EIF, b/l renal pyelectasis. Normal monthly growth, resolution of all abnl findings.   - GBS pos - +FFN at 23 wks but now term SROM  Prenatal Transfer Tool  Maternal Diabetes: No Genetic Screening: Abnormal:  Results: Other: XXX Maternal Ultrasounds/Referrals: Abnormal:  Findings:   Fetal renal pyelectasis, Other: EIF, cystic hygroma Fetal Ultrasounds or other Referrals:  Fetal echo, Referred to Materal Fetal Medicine  Maternal Substance Abuse:  No Significant Maternal Medications:  None Significant Maternal Lab Results: None     OB History   Grav Para Term Preterm Abortions TAB SAB Ect Mult Living   2 1 1  0 0 0 0 0 0 1     Past Medical History  Diagnosis Date  . Asthma    Past Surgical History  Procedure Laterality Date  . Cesarean section  2002  . Wisdom tooth extraction     Family History: family history is not on file. Social History:  reports that she has never smoked. She does not have any smokeless tobacco history on file. She reports that she does not drink alcohol or use illicit drugs.  Review of Systems - Negative except continued leakage of fluid   Exam by:: Dr Ernestina PennaFogleman Blood pressure 108/62, pulse 87, temperature 98.2 F (36.8 C), resp. rate 18, height 5\' 2"  (1.575 m), weight 89.54 kg (197 lb 6.4 oz), last menstrual period 06/18/2013.  Physical Exam:  Gen: well appearing, no distress, uncomfrotable with  contraction  Back: no CVAT Abd: gravid, NT, no RUQ pain, no incarcerated hernia LE: trace edema, equal bilaterally, non-tender Toco: q 2min FH: baseline 130s, accelerations present, no deceleratons, 10 beat variability  Prenatal labs: ABO, Rh: A/Positive/-- (03/27 0000) Antibody: Negative (03/27 0000) Rubella:  immune RPR: Nonreactive (03/27 0000)  HBsAg: Negative (03/27 0000)  HIV: Non-reactive (03/27 0000)  GBS:   positive 1 hr Glucola 117  Genetic screening abnormal NT scan with 1:5 risk down syndrome, informaseq with normal T13,18,21 but XXX, unclear if XXX mosaic Anatomy US cystic hygroma, EIF, b/l renal pyelectasis. Normal monthly growth, resolution of all abnl findings.    Assessment/Plan: 38 y.o. G2P1001 at 1486w6d - SROM, prior c/s, plan RCS. R/B reviewed w/ pt - known  Umbilical hernia, can eval intraop but general surgeon not planning on repair - fetal chromosomal abnl- peds to eval - GBS pos. Ancef at c/s   Jasper Hanf A. 03/17/2014, 5:47 AM

## 2014-03-17 NOTE — MAU Note (Signed)
Dr Ernestina PennaFogleman discussed repeat C/S with pt. PT plans to proceed with repeat C/s . Permit signed

## 2014-03-17 NOTE — Lactation Note (Signed)
This note was copied from the chart of Mallory Mallory ApleyFioanna Washko. Lactation Consultation Note  Patient Name: Mallory Martin AVWUJ'WToday's Date: 03/17/2014 Reason for consult: Initial assessment of this mom and baby at 15 hours postpartum. Mom is a second-time breastfeeding mother and states she nursed her 913 yo child. Her newborn has been latching well and she denies nipple discomfort.  LC reviewed hand expression and nipple care, encouraged frequent STS and cue feedings ad lib for maximum milk supply.  LATCH scores=8 per RN assessment at several feedings and baby has had first void and first stool.  Mom encouraged to feed baby 8-12 times/24 hours and with feeding cues. LC encouraged review of Baby and Me pp 9, 14 and 20-25 for STS and BF information. LC provided [employer] brochure and reviewed [facility] and list of community and web site resources.    Maternal Data Formula Feeding for Exclusion: No Has patient been taught Hand Expression?: Yes (LC reviewed and demonstrated technique) Does the patient have breastfeeding experience prior to this delivery?: Yes  Feeding    LATCH Score/Interventions              LATCH scores=8 x2 by RN assessment        Lactation Tools Discussed/Used   STS, cue feedings, hand expression  Consult Status Consult Status: Follow-up Date: 03/18/14 Follow-up type: In-patient    Warrick ParisianBryant, Ledford Goodson [facility] 03/17/2014, 10:01 PM

## 2014-03-17 NOTE — MAU Note (Signed)
Leaking clear fld since 0405. When wipes some bloody mucous. Some cramping

## 2014-03-17 NOTE — Op Note (Signed)
03/17/2014  7:47 AM  PATIENT:  Mallory Martin  38 y.o. female  PRE-OPERATIVE DIAGNOSIS:  Previous Cesarean Section Term Spontaneous Rupture of Membranes  POST-OPERATIVE DIAGNOSIS:  Previous Cesarean Section Term Spontaneous Rupture of Membranes  PROCEDURE:  Procedure(s) with comments: CESAREAN SECTION (N/A) - repeat c-section LTCS, 2 layer closure  SURGEON:  Surgeon(s) and Role:    * Giselle Brutus A. Ernestina PennaFogleman, MD - Primary  PHYSICIAN ASSISTANT:   ASSISTANTS: Fredric MareBailey, CNM   ANESTHESIA:   spinal  EBL:  Total I/O In: -  Out: 900 [Urine:400; Blood:500]  BLOOD ADMINISTERED:none  DRAINS: Urinary Catheter (Foley)   LOCAL MEDICATIONS USED:  NONE  SPECIMEN:  Source of Specimen:  placenta, cord pH  DISPOSITION OF SPECIMEN:  L&D  COUNTS:  YES  TOURNIQUET:  * No tourniquets in log *  DICTATION: .Note written in EPIC  PLAN OF CARE: Admit to inpatient   PATIENT DISPOSITION:  PACU - hemodynamically stable.   Delay start of Pharmacological VTE agent (>24hrs) due to surgical blood loss or risk of bleeding: yes   Findings:  @BABYSEXEBC @ infant,  APGAR (1 MIN): 8   APGAR (5 MINS): 9   APGAR (10 MINS):   Normal uterus, tubes, normal placenta. 3VC, clear amniotic fluid, non-visualization of b/l ovaries, no clear umbilical hernia  EBL: 500 cc Antibiotics:   2g Ancef Complications: none  Indications: This is a 38 y.o. year-old, G2P1001  At 1360w6d admitted for SROM, early labor, plans for RCS. Risks benefits and alternatives of the procedure were discussed with the patient who agreed to proceed  Procedure:  After informed consent was obtained the patient was taken to the operating room where spinal anesthesia was initiated.  She was prepped and draped in the normal sterile fashion in dorsal supine position with a leftward tilt.  A foley catheter was in place.  A Pfannenstiel skin incision was made 2 cm above the pubic symphysis in the midline with the scalpel.  Dissection was carried down  with the Bovie cautery until the fascia was reached. The fascia was incised in the midline. The incision was extended laterally with the Mayo scissors. The inferior aspect of the fascial incision was grasped with the Coker clamps, elevated up and the underlying rectus muscles were dissected off sharply. The superior aspect of the fascial incision was grasped with the Coker clamps elevated up and the underlying rectus muscles were dissected off sharply.  The peritoneum was entered bluntly. The peritoneal incision was extended superiorly and inferiorly with good visualization of the bladder. The bladder blade was inserted and palpation was done to assess the fetal position and the location of the uterine vessels. A small bladder flap was created sharply. The lower segment of the uterus was incised sharply. Given the thickness of the non-laboring uterus, vessels associated with an anterior placenta, brisk bleeding from the uterus was noted which impaired visualization. Allis clamps were placed on the edges of the incision to further elevated. Once incision was safely made to the amniotic sac with the scalpel, it wasextended  bluntly in the cephalo-caudal fashion. Membranes were ruptured. The infant was grasped, brought to the incision,  rotated and the infant was delivered with fundal pressure. The nose and mouth were bulb suctioned. The cord was clamped and cut. The infant was handed off to the waiting pediatrician. The placenta was expressed. The uterus was left in situ. The uterus was cleared of all clots and debris. The uterine incision was repaired with 0 Vicryl in a running  locked fashion.  A second layer of the same suture was used in an imbricating fashion to obtain excellent hemostasis. The gutters were cleared of all clots and debris. Unable to visualize the ovaries. Tubes seen and proximally normal but unable to see fimbrae and distal tube. With packing the bowel in order to better visualize anatomy, pt  started with excessive emesis/ nausea and further attempt to see ovaries abandoned. The umbilicus was evaluated, no clear fascial defect was noted.  The uterine incision was reinspected and found to be hemostatic. The peritoneum was grasped and closed with 2-0 Vicryl in a running fashion. The cut muscle edges and the underside of the fascia were inspected and found to be hemostatic. The fascia was closed with 0 Vicryl in two halves. The subcutaneous tissue was irrigated. Scarpa's layer was closed with a 2-0 plain gut suture. The skin was closed with a 4-0 Monocryl in a single layer. The patient tolerated the procedure well. Sponge lap and needle counts were correct x3 and patient was taken to the recovery room in a stable condition.  Jesseca Marsch A. 03/17/2014 7:49 AM

## 2014-03-17 NOTE — Anesthesia Procedure Notes (Signed)
Spinal  Patient location during procedure: OB Start time: 03/17/2014 6:21 AM End time: 03/17/2014 6:32 AM Staffing Anesthesiologist: Neysha Criado, CHRIS Preanesthetic Checklist Completed: patient identified, surgical consent, pre-op evaluation, timeout performed, IV checked, risks and benefits discussed and monitors and equipment checked Spinal Block Patient position: sitting Prep: site prepped and draped and DuraPrep Patient monitoring: heart rate, cardiac monitor, continuous pulse ox and blood pressure Approach: midline Location: L3-4 Injection technique: single-shot Needle Needle type: Pencan  Needle gauge: 24 G Needle length: 10 cm Assessment Sensory level: T6

## 2014-03-17 NOTE — Progress Notes (Signed)
Dr Ernestina PennaFogleman in to see pt. Spec exam to confirm SROM

## 2014-03-17 NOTE — Anesthesia Preprocedure Evaluation (Signed)
Anesthesia Evaluation  Patient identified by MRN, date of birth, ID band Patient awake    Reviewed: Allergy & Precautions, H&P , NPO status , Patient's Chart, lab work & pertinent test results  History of Anesthesia Complications Negative for: history of anesthetic complications  Airway Mallampati: II TM Distance: >3 FB Neck ROM: Full    Dental  (+) Teeth Intact   Pulmonary asthma ,  breath sounds clear to auscultation        Cardiovascular negative cardio ROS  Rhythm:Regular     Neuro/Psych    GI/Hepatic negative GI ROS, Neg liver ROS,   Endo/Other  negative endocrine ROS  Renal/GU negative Renal ROS     Musculoskeletal   Abdominal   Peds  Hematology  (+) anemia ,   Anesthesia Other Findings   Reproductive/Obstetrics (+) Pregnancy                           Anesthesia Physical Anesthesia Plan  ASA: II  Anesthesia Plan: Spinal   Post-op Pain Management:    Induction:   Airway Management Planned:   Additional Equipment:   Intra-op Plan:   Post-operative Plan:   Informed Consent: I have reviewed the patients History and Physical, chart, labs and discussed the procedure including the risks, benefits and alternatives for the proposed anesthesia with the patient or authorized representative who has indicated his/her understanding and acceptance.   Dental advisory given  Plan Discussed with: Anesthesiologist  Anesthesia Plan Comments:         Anesthesia Quick Evaluation

## 2014-03-17 NOTE — Anesthesia Postprocedure Evaluation (Signed)
  Anesthesia Post Note  Patient: Mallory Martin  Procedure(s) Performed: Procedure(s) (LRB): CESAREAN SECTION (N/A)  Anesthesia type: Spinal  Patient location: PACU  Post pain: Pain level controlled  Post assessment: Post-op Vital signs reviewed  Last Vitals:  Filed Vitals:   03/17/14 0800  BP: 104/48  Pulse: 69  Temp: 36.3 C  Resp: 18    Post vital signs: Reviewed  Level of consciousness: awake  Complications: No apparent anesthesia complications

## 2014-03-18 LAB — CBC
HCT: 31.5 % — ABNORMAL LOW (ref 36.0–46.0)
Hemoglobin: 10.7 g/dL — ABNORMAL LOW (ref 12.0–15.0)
MCH: 28.8 pg (ref 26.0–34.0)
MCHC: 34 g/dL (ref 30.0–36.0)
MCV: 84.9 fL (ref 78.0–100.0)
PLATELETS: 223 10*3/uL (ref 150–400)
RBC: 3.71 MIL/uL — ABNORMAL LOW (ref 3.87–5.11)
RDW: 13 % (ref 11.5–15.5)
WBC: 13.5 10*3/uL — ABNORMAL HIGH (ref 4.0–10.5)

## 2014-03-18 MED ORDER — PNEUMOCOCCAL VAC POLYVALENT 25 MCG/0.5ML IJ INJ
0.5000 mL | INJECTION | INTRAMUSCULAR | Status: AC
  Administered 2014-03-19: 0.5 mL via INTRAMUSCULAR
  Filled 2014-03-18: qty 0.5

## 2014-03-18 NOTE — Progress Notes (Signed)
Patient ID: Mallory Martin, female   DOB: 12/05/1975, 38 y.o.   MRN: [id number] Subjective: POD# 1 Information for the patient's newborn:  Mallory Martin, Girl Mallory Martin [[id number]][[id number]]  female   Reports feeling a little sore Feeding: breast Patient reports tolerating PO.  Breast symptoms: none Pain controlled with ibuprofen (OTC) and narcotic analgesics including Percocet Denies HA/SOB/C/P/N/V/dizziness. Flatus present. No BM. She reports vaginal bleeding as normal, without clots.  She is ambulating, urinating without difficult.     Objective:   VS:  Filed Vitals:   03/17/14 1900 03/17/14 2300 03/18/14 0257 03/18/14 0552  BP: 100/56 100/51 99/48 104/51  Pulse: 78 83 80 70  Temp: 98.7 F (37.1 C) 99.1 F (37.3 C) 98.6 F (37 C) 98 F (36.7 C)  TempSrc: Axillary Axillary Axillary Oral  Resp: 18 18 20 17   Height:      Weight:      SpO2: 99% 97% 98% 100%     Intake/Output Summary (Last 24 hours) at 03/18/14 0839 Last data filed at 03/18/14 0538  Gross per 24 hour  Intake 1993.75 ml  Output   5125 ml  Net -3131.25 ml        Recent Labs  03/17/14 0555 03/18/14 0558  WBC 9.2 13.5*  HGB 11.8* 10.7*  HCT 35.1* 31.5*  PLT 246 223     Blood type: A POS (10/24 0555)  Rubella: Immune (03/27 0000)     Physical Exam:  General: alert, cooperative and no distress CV: Regular rate and rhythm, S1S2 present or without murmur or extra heart sounds Resp: clear Abdomen: soft, nontender, normal bowel sounds Incision: Tegaderm and Honeycomb dressing C/D/I - skin well-approximated with sutures Uterine Fundus: firm, 1 FB below umbilicus, nontender Lochia: moderate Ext: extremities normal, atraumatic, no cyanosis or edema, Homans sign is negative, no sign of DVT and no edema, redness or tenderness in the calves or thighs   Assessment/Plan: 38 y.o.   POD# 1.  s/p Cesarean Delivery.  Indications: SROM and fetal anomalies                Principal Problem:   Postpartum care following cesarean  delivery (10/24) Active Problems:   Delivery by cesarean section at 37-39 weeks of gestation due to labor  Doing well, stable.               Regular diet as tolerated Ambulate Routine post-op care Consider early discharge home tomorrow  Mallory Martin, Mallory Martin, M, MSN, CNM 03/18/2014, 8:39 AM

## 2014-03-19 ENCOUNTER — Inpatient Hospital Stay (HOSPITAL_COMMUNITY)
Admission: RE | Admit: 2014-03-19 | Discharge: 2014-03-19 | Disposition: A | Discharge status: Home / Self Care | Payer: BC Managed Care – PPO | Source: Ambulatory Visit

## 2014-03-19 ENCOUNTER — Other Ambulatory Visit (HOSPITAL_COMMUNITY): Payer: BC Managed Care – PPO

## 2014-03-19 ENCOUNTER — Encounter (HOSPITAL_COMMUNITY): Payer: Self-pay | Admitting: Obstetrics

## 2014-03-19 NOTE — Progress Notes (Signed)
POSTOPERATIVE DAY # 2 S/P CS   S:         Reports feeling sore and aching this am             Tolerating po intake / no nausea / no vomiting / + flatus / no BM             Bleeding is light             Pain controlled with motrin and percocet             Up ad lib / ambulatory/ voiding QS  Newborn breast feeding -assisted with latch  O:  VS: BP 107/56  Pulse 71  Temp(Src) 98.4 F (36.9 C) (Oral)  Resp 18  Ht 5\' 2"  (1.575 m)  Wt 89.54 kg (197 lb 6.4 oz)  BMI 36.10 kg/m2  SpO2 100%  LMP 06/18/2013  Breastfeeding? Unknown   LABS:               Recent Labs  03/17/14 0555 03/18/14 0558  WBC 9.2 13.5*  HGB 11.8* 10.7*  PLT 246 223               Bloodtype: --/--/A POS, A POS (10/24 0555)  Rubella: Immune (03/27 0000)                                             I&O: Intake/Output     10/25 0701 - 10/26 0700 10/26 0701 - 10/27 0700   P.O.     I.V. (mL/kg)     Total Intake(mL/kg)     Urine (mL/kg/hr) 900 (0.4)    Blood     Total Output 900     Net -900                       Physical Exam:             Alert and Oriented X3  Lungs: Clear and unlabored  Heart: regular rate and rhythm / no mumurs  Abdomen: soft, non-tender, non-distended active BS             Fundus: firm, non-tender, U-1             Dressing intact              Incision:  approximated with suture / no erythema / no ecchymosis / no drainage  Perineum: intact  Lochia: light  Extremities: no edema, no calf pain or tenderness, neg Homans  A:        POD # 2 S/P CS             P:        Routine postoperative care              Anticipate Dc tomorrow              try K-pad today to abdomen for comfort / warm fluids and ambulation to facilitate bowel motility   Marlinda MikeBAILEY, Jonnae Fonseca CNM, MSN, [facility] 03/19/2014, 8:36 AM

## 2014-03-19 NOTE — Lactation Note (Signed)
This note was copied from the chart of Mallory Martin. Lactation Consultation Note  Patient Name: Mallory Sheral ApleyFioanna Streicher UJWJX'BToday's Date: 03/19/2014 Reason for consult: Follow-up assessment;Breast/nipple pain Mom reports it feels like baby is "biting" at the breast. Mom had baby in football hold nursing when I arrived but baby was sleepy. Baby had been nursing almost 60 minutes, no noted swallows at this visit. Mom's nipples are cracked at the end bilateral. Mom reports PS=10 with initial latch that improves to 5-6 with baby nursing. On suck exam baby is not bringing her tongue forward. LC can feel baby's lower gum on my finger when suckling. Baby does have a biting/chewing motion on my finger. Short, mid to posterior frenulum noted. Suck training exercises discussed.  Care for sore nipples reviewed with Mom, comfort gels given with instructions. Discussed possibly using a nipple shield due to nipple pain, Mom wants to try. LC advised Mom to call with next feeding for LC assist. Baby at 7% weight loss, last stool was greater than 24 hours but has had 4 stools in her life, baby has had 8 voids in her life with 2 in the past 24 hours.   Maternal Data    Feeding Feeding Type: Breast Fed Length of feed: 10 min  LATCH Score/Interventions          Comfort (Breast/Nipple): Engorged, cracked, bleeding, large blisters, severe discomfort Problem noted: Cracked, bleeding, blisters, bruises  Interventions (Mild/moderate discomfort): Comfort gels        Lactation Tools Discussed/Used     Consult Status Consult Status: Follow-up Date: 03/19/14 Follow-up type: In-patient    Alfred LevinsGranger, Jill Stopka Ann 03/19/2014, 2:41 PM

## 2014-03-19 NOTE — Lactation Note (Signed)
This note was copied from the chart of Mallory Martin. Lactation Consultation Note Noted 7% wt. Loss. Assessed feedings. Had 4 poops and 7 pee's since birth. Mom BF in football position, noted nipple between baby's cheek and breast. Encouraged to keep baby close to prevent nipple soreness and obtain a deep latch. Encourage mom to use support for when her hand gets tired to keep baby close to breast during feedings. Discussed wt. Loss, prevention, and monitoring. Hand expression to check for colostrum. Noted colostrum to end of nipple. Baby feeding well at breast, explained if doesn't have a deep latch, may not obtain as much as colostrum. Picture shown to pg. 24 in Baby and Me. Will monitor. Patient Name: Mallory Sheral ApleyFioanna Mangino ZOXWR'UToday's Date: 03/19/2014 Reason for consult: Infant weight loss   Maternal Data    Feeding Feeding Type: Breast Fed Length of feed: 20 min (still bf)  LATCH Score/Interventions Latch: Grasps breast easily, tongue down, lips flanged, rhythmical sucking. Intervention(s): Adjust position;Breast massage  Audible Swallowing: A few with stimulation Intervention(s): Skin to skin;Hand expression;Alternate breast massage  Type of Nipple: Everted at rest and after stimulation  Comfort (Breast/Nipple): Soft / non-tender     Hold (Positioning): Assistance needed to correctly position infant at breast and maintain latch. Intervention(s): Breastfeeding basics reviewed;Support Pillows;Position options;Skin to skin  LATCH Score: 8  Lactation Tools Discussed/Used     Consult Status Consult Status: Follow-up Date: 03/19/14 Follow-up type: In-patient    Charyl DancerCARVER, Kamrynn Melott G 03/19/2014, 6:14 AM

## 2014-03-19 NOTE — Lactation Note (Signed)
This note was copied from the chart of Mallory Martin Anglemyer. Lactation Consultation Note Mom c/o severe pain during BF. Noted moms nipples bilaterally to have red torn skin that is raw tissue . Comfort gels applied. Fitted for #20 NS, states feels much comfort during feeding. Started SNS d/t baby hasn't pooped in 24 hrs. Concerned that baby has tight upper labial lip frenulum, and lower tongue doe pass the gum line, but initial suck is a bite the baby cups tongue around nipple. Tongue is slightly thick, shallow palate. Feeding amount chart for formula supplemental feeding sheet given. Demonstrated SNS set up and feeding. Mom states has good understanding. Patient Name: Mallory Martin Peth [id number]'s Date: 03/19/2014 Reason for consult: Follow-up assessment;Breast/nipple pain   Maternal Data    Feeding Feeding Type: Formula Length of feed: 10 min  LATCH Score/Interventions Latch: Grasps breast easily, tongue down, lips flanged, rhythmical sucking. Intervention(s): Adjust position;Assist with latch;Breast massage;Breast compression  Audible Swallowing: Spontaneous and intermittent Intervention(s): Skin to skin;Hand expression;Alternate breast massage  Type of Nipple: Everted at rest and after stimulation  Comfort (Breast/Nipple): Engorged, cracked, bleeding, large blisters, severe discomfort Problem noted: Cracked, bleeding, blisters, bruises Intervention(s): Expressed breast milk to nipple  Problem noted: Severe discomfort Interventions  (Cracked/bleeding/bruising/blister): Expressed breast milk to nipple Interventions (Mild/moderate discomfort): Hand massage;Hand expression;Comfort gels;Breast shields  Hold (Positioning): Assistance needed to correctly position infant at breast and maintain latch. Intervention(s): Breastfeeding basics reviewed;Support Pillows;Position options;Skin to skin  LATCH Score: 7  Lactation Tools Discussed/Used Tools: Nipple Dorris CarnesShields;Supplemental Nutrition  System;Comfort gels Nipple shield size: 20   Consult Status Consult Status: Follow-up Date: 03/20/14 Follow-up type: In-patient    Charyl DancerCARVER, Destany Severns G 03/19/2014, 5:49 PM

## 2014-03-20 ENCOUNTER — Inpatient Hospital Stay (HOSPITAL_COMMUNITY): Admission: RE | Admit: 2014-03-20 | Payer: BC Managed Care – PPO | Source: Ambulatory Visit

## 2014-03-20 DIAGNOSIS — D62 Acute posthemorrhagic anemia: Secondary | ICD-10-CM | POA: Diagnosis not present

## 2014-03-20 DIAGNOSIS — O9279 Other disorders of lactation: Secondary | ICD-10-CM

## 2014-03-20 HISTORY — DX: Acute posthemorrhagic anemia: D62

## 2014-03-20 MED ORDER — OXYCODONE-ACETAMINOPHEN 5-325 MG PO TABS: 1.0000 | ORAL_TABLET | ORAL | Status: DC | PRN

## 2014-03-20 NOTE — Lactation Note (Signed)
This note was copied from the chart of Mallory Martin. Lactation Consultation Note  Patient Name: Mallory Martin AVWUJ'WToday's Date: 03/20/2014 Reason for consult: Follow-up assessment;Breast/nipple pain;Difficult latch;Infant weight loss;Infant < 6lbs Mom called for assist with breastfeeding. LC assisted Mom with latching baby to right breast using the #20 nipple shield. Took few attempts to get baby latched and to stimulate suckling. Demonstrated to Mom how to bring the lips out to be well flanged with latch. Baby sleepy at the breast and nursed off/on for 15 minutes with lots of stimulation to keep awake at breast. Some non-nutritive and nutritive suckling observed. Mom denies nipple discomfort using the nipple shield.  Scant amount of breast milk in the nipple shield. LC discussed with Mom concern that baby is not transferring milk well at the breast. Due to weight loss LC encouraged Mom to limit time at breast to 30 minutes to conserve calorie usage with feedings. Advised to BF whenever baby is hungry but at least every 2-3 hours. Try to keep baby active at the breast for up to 30 minutes, 1 breast per feeding. Post pump for 15 minutes both breasts then supplement with EBM/formula as needed per hours of age guidelines. Advised Mom to start supplementing with 30-45 ml each feeding with EBM/Formula per Dr. Dillard EssexGay's instructions.  Advised Mom to use nipple shield to latch. At night advised Mom she could pump/bottle feed if feeling tired/overwhelmed. Advised to monitor baby's voids/stools. If baby not latching advised to be sure she pumps her breast every 3 hours to prevent engorgement and protect milk supply. Mom rented DEBP for home use.  Written engorgement plan given to Mom for d/c home if needed. Mom using ice this am and breasts are not engorged at this time.  Lactation OP f/u scheduled for Friday, 03/23/14 at 10:30. Ped f/u tomorrow.   Maternal Data    Feeding Feeding Type: Bottle Fed - Breast  Milk Length of feed: 15 min  LATCH Score/Interventions Latch: Repeated attempts needed to sustain latch, nipple held in mouth throughout feeding, stimulation needed to elicit sucking reflex. (using #20 nipple shield) Intervention(s): Adjust position;Assist with latch  Audible Swallowing: None  Type of Nipple: Everted at rest and after stimulation  Comfort (Breast/Nipple): Filling, red/small blisters or bruises, mild/mod discomfort Problem noted: Cracked, bleeding, blisters, bruises Intervention(s): Ice;Hand expression Intervention(s): Expressed breast milk to nipple;Double electric pump  Interventions (Mild/moderate discomfort): Comfort gels;Post-pump;Pre-pump if needed  Hold (Positioning): Assistance needed to correctly position infant at breast and maintain latch.  LATCH Score: 5  Lactation Tools Discussed/Used Tools: Nipple Dorris CarnesShields;Pump Nipple shield size: 20 Breast pump type: Double-Electric Breast Pump   Consult Status Consult Status: Complete Date: 03/20/14 Follow-up type: In-patient    Alfred LevinsGranger, Awanda Wilcock Ann 03/20/2014, 2:48 PM

## 2014-03-20 NOTE — Discharge Summary (Signed)
POSTOPERATIVE DISCHARGE SUMMARY:  Patient ID: Mallory Martin MRN: [id number] DOB/AGE: 37/12/1975 37 y.o.  Admit date: 03/17/2014 Admission Diagnoses: Term SROM, previous CS   Discharge date: 03/20/2014 Discharge Diagnoses: S/P C/S on 03/17/14, ABL anemia        Prenatal history: G2P2002   EDC : 03/25/2014, by Last Menstrual Period  [facility] since [redacted] wks gestation. Primary provider : Dr. Ernestina PennaFogleman Prenatal course complicated by AMA, previous CS, +GBS, abnormal genetic screen, b/l renal pyelectatsis, EIF, cystic hygroma-resolution of abnormal findings.  Prenatal labs: ABO, Rh: --/--/A POS, A POS (10/24 0555)  Antibody: NEG (10/24 0555) Rubella:   IMMUNE RPR: NON REAC (10/24 0555)  HBsAg: Negative (03/27 0000)  HIV: Non-reactive (03/27 0000)  GBS:   POS GTT: 117  Medical / Surgical History :  Past medical history:  Past Medical History  Diagnosis Date  . Asthma   . Postpartum care following cesarean delivery (10/24) 03/17/2014    Past surgical history:  Past Surgical History  Procedure Laterality Date  . Cesarean section  2002  . Wisdom tooth extraction    . Cesarean section N/A 03/17/2014    Procedure: CESAREAN SECTION;  Surgeon: Tresa EndoKelly A. Ernestina PennaFogleman, MD;  Location: [facility];  Service: Obstetrics;  Laterality: N/A;  repeat c-section     Medications on Admission: Prescriptions prior to admission  Medication Sig Dispense Refill  . DiphenhydrAMINE HCl (BENADRYL PO) Take 1 tablet by mouth daily as needed (sleep).      . hydrocortisone cream 1 % Apply 1 application topically 2 (two) times daily as needed for itching (rash).      . Prenatal Vit-Fe Fumarate-FA (PRENATAL MULTIVITAMIN) TABS tablet Take 1 tablet by mouth daily at 12 noon.        Allergies: Advil; Excedrin extra strength; Oxycodone; and Vicodin   Intrapartum Course:  Admitted for labor and repeat CS under regional anesthesia.   Postpartum Course: Complicated by mild ABL  anemia.  Physical Exam:   VSS: Blood pressure 104/56, pulse 71, temperature 98.7 F (37.1 C), temperature source Oral, resp. rate 18, height 5\' 2"  (1.575 m), weight 89.54 kg (197 lb 6.4 oz), last menstrual period 06/18/2013, SpO2 98.00%, unknown if currently breastfeeding.  LABS:  Recent Labs  03/18/14 0558  WBC 13.5*  HGB 10.7*  PLT 223    General: Alert and oriented x3 Heart: RRR Lungs: CTA bilaterally GI: soft, non-tender, non-distended, BS x4 Lochia: small Uterus: firm below umbilicus Incision: well approximated; honeycomb dressing-no significant erythema, drainage, or edema Extremities: No edema, Homans neg   Newborn Data Live born female  Birth Weight: 6 lb 8.1 oz (2951 g) APGAR: 8, 9  See operative report for further details  Home with mother.  Discharge Instructions:  Wound Care: keep clean and dry / remove honeycomb POD 6 Postpartum Instructions: [facility] discharge booklet - instructions reviewed Medications:    Medication List    STOP taking these medications       BENADRYL PO     hydrocortisone cream 1 %      TAKE these medications       oxyCODONE-acetaminophen 5-325 MG per tablet  Commonly known as:  PERCOCET/ROXICET  Take 1-2 tablets by mouth every 4 (four) hours as needed (for pain scale equal to or greater than 7).     prenatal multivitamin Tabs tablet  Take 1 tablet by mouth daily at 12 noon.            Follow-up Information  Follow up with Lendon ColonelFOGLEMAN,KELLY A., MD.   Specialty:  Obstetrics and Gynecology   Contact information:   [address] [place] [id number] [phone]         Signed: Donette LarryBHAMBRI, Makyi Ledo, Dorris CarnesN MSN, CNM 03/20/2014, 12:13 PM

## 2014-03-20 NOTE — Lactation Note (Signed)
This note was copied from the chart of Mallory Martin. Lactation Consultation Note  Patient Name: Mallory Martin [id number]'s Date: 03/20/2014 Reason for consult: Follow-up assessment;Breast/nipple pain;Infant weight loss;Infant < 6lbs Mom has been pump/bottle feeding during the night due to nipple pain. The SNS was overwhelming for Mom so she is supplementing with bottle. Mom is supplementing with EBM/formula each feeding. Mom milk is coming in, her breasts are full, some nodules present in the outer quadrants. Mom has pumped 2 times. Mom reports she plans to re-introduce breast today. LC asked Mom to call with next feeding for assist and to help Mom finalize a feeding plan before d/c home. Mom plans to rent DE[employer] for home use till her pump from insurance arrives. Baby spitty at this visit, recently has supplement. Baby has had 2 voids/2 stools in past 24 hours. Mom reports she is to f/u with Peds tomorrow for weight check. Discussed options for feeding plans with parents. Advised Mom if she is able to breastfeed, limit time at breast to 30 minutes to conserve calorie usage with feedings, let FOB give supplement (advised to increase to 20-25 with each feeding today if baby tolerates), Mom to post pump after feedings to prevent further engorgement and protect milk supply. If Mom does not BF, decides to pump/bottle, she will need to pump every 2-3 hours and supplement per guidelines. Encourage to schedule OP f/u with Lactation. Mom to call with next feeding.   Maternal Data    Feeding Feeding Type: Formula Nipple Type: Slow - flow  LATCH Score/Interventions          Comfort (Breast/Nipple): Engorged, cracked, bleeding, large blisters, severe discomfort Problem noted: Engorgment;Cracked, bleeding, blisters, bruises Intervention(s): Ice (double electric pump) Intervention(s): Double electric pump  Problem noted: Severe discomfort Interventions  (Cracked/bleeding/bruising/blister): Double  electric pump;Hand pump Interventions (Mild/moderate discomfort): Comfort gels;Breast shields Interventions (Severe discomfort): Double electric pum        Lactation Tools Discussed/Used Tools: Nipple Shields;Pump;Comfort gels;Supplemental Nutrition System Nipple shield size: 20 Breast pump type: Double-Electric Breast Pump   Consult Status Consult Status: Follow-up Date: 03/20/14 Follow-up type: In-patient    Mallory LevinsGranger, Mallory Martin 03/20/2014, 9:04 AM

## 2014-03-20 NOTE — Progress Notes (Addendum)
POD # 3  Subjective: Pt reports feeling well/ Pain controlled with Percocet Tolerating po/Voiding without problems/ No n/v/ Flatus present Activity: ad lib Bleeding is light Newborn info:  Information for the patient's newborn:  Pridgen, Girl Kyera [[id number]]  female Feedi[[id number]]ng: breast and bottle; some nipple pain/damage   Objective: VS: VS:  Filed Vitals:   03/18/14 1746 03/19/14 0540 03/19/14 1705 03/20/14 0531  BP: 100/57 107/56 106/46 104/56  Pulse: 67 71 68 71  Temp: 98.1 F (36.7 C) 98.4 F (36.9 C) 98.8 F (37.1 C) 98.7 F (37.1 C)  TempSrc: Oral Oral Oral Oral  Resp: 18 18 18    Height:      Weight:      SpO2: 100%   98%    I&O: Intake/Output     10/26 0701 - 10/27 0700 10/27 0701 - 10/28 0700   Urine (mL/kg/hr)     Total Output       Net              LABS:  Recent Labs  03/18/14 0558  WBC 13.5*  HGB 10.7*  PLT 223                           Physical Exam:  General: alert and cooperative CV: Regular rate and rhythm Resp: CTA bilaterally Abdomen: soft, nontender, normal bowel sounds Incision: healing well, no drainage, no erythema, no hernia, no seroma, no swelling, well approximated; honeycomb dsg c/d/i Uterine Fundus: firm, below umbilicus, nontender Lochia: minimal Ext: extremities normal, atraumatic, no cyanosis or edema and Homans sign is negative, no sign of DVT    Assessment: POD # 3/ G2P2002/ S/P C/Section d/t repeat in labor ABL anemia Lactation disorder, postpartum Doing well and stable for discharge home  Plan: Discharge home RX's: Percocet 5/325 1 - 2 tabs po every 4 hrs prn pain #30 Refill x 0 [facility] booklet given Lactation consult outpatient within 1 week    Signed: Donette LarryBHAMBRI, Sharmel Ballantine, N, MSN, CNM 03/20/2014, 10:22 AM

## 2014-03-21 ENCOUNTER — Inpatient Hospital Stay (HOSPITAL_COMMUNITY): Admission: AD | Admit: 2014-03-21 | Payer: BC Managed Care – PPO | Source: Ambulatory Visit | Admitting: Obstetrics

## 2014-03-21 SURGERY — Surgical Case
Anesthesia: Spinal

## 2014-03-23 ENCOUNTER — Ambulatory Visit (HOSPITAL_COMMUNITY)
Admit: 2014-03-23 | Discharge: 2014-03-23 | Disposition: A | Discharge status: Home / Self Care | Payer: BC Managed Care – PPO | Attending: Obstetrics | Admitting: Obstetrics

## 2014-03-23 NOTE — Lactation Note (Signed)
Lactation Consult  Mother's reason for visit: Improve breastfeeding Visit Type: Outpatient Consult:  Initial Lactation Consultant:  Remigio Eisenmengerichey, Nabeeha Badertscher Hamilton  ________________________________________________________________________ BW: [phone] (6# 8.1oz) 03-20-14: 5# 13.5oz (down 10.2%) 03-23-14: 6# 5.2oz (down 2.7%). Baby has gained 8 oz in 3 days ____________________________________________  Mother's Name: Mallory Martin Type of delivery:  C/S Breastfeeding Experience: 13 years ago: nursed for 6 months Maternal Medical Conditions:  None Maternal Medications: Percocet, PNV,  ________________________________________________________________________  Breastfeeding History (Post Discharge)  Frequency of breastfeeding: pumping q3 hrs Duration of pumping: 15-520min; 10 mL total  Supplementation  Formula:  Volume: 30 ml? Frequency: 8? Total volume per day:  240 ml? (Weight gain would necessitate that baby is taking more than this).        Brand: Similac Neosure   Breastmilk:  Volume 10 ml Frequency: 8 Total volume per day:  80 ml  Method:  Bottle  Infant Intake and Output Assessment  Voids:  10 in 24 hrs.  Color:  Clear yellow Stools:   in 24 hrs.  Color:  Brown  ________________________________________________________________________  Maternal Breast Assessment  Breast:  Near axilla, you can feel nodules. However, anterior portion of breast is soft- Nipple:  Erect, healing   _______________________________________________________________________ Feeding Assessment/Evaluation  Initial feeding assessment:  Attached assessment:  Deep  Lips flanged:  Yes.      Suck assessment:  Displays both   Pre-feed weight: 2870 g  ( 6 lb. 5.2 oz.) Post-feed weight:  2876 g Amount transferred:  6 ml R breast football, 30 min  Total amount pumped post feed:  Less than 5 ml obtained in about 10 min  Total amount transferred:  6 ml Total supplement given:  3436 ml+  Mom is  almost exclusively pumping and BO.  She puts baby to the breast once/day. She no longer needs a nipple shield. [name] needed a little coaxing to go to the bare breast, but she was able to do so with a good latch.  [name] only transferred 6 mL in a 30 min feeding.  Mom's nipple noted to be pinched when baby released latch. Parents prefer to continue supplementing with a bottle.   Breast exam: Mom's nipples are healing nicely. Nodules are felt in the outer quadrants, but the anterior portion of Mom's breasts are soft.  Per mom, she was "engorged" on Tuesday.  The anterior portion of Mom's breasts lack the fullness that I would expect.   Mom observed pumping.  The size 27 flange was too large for her.  She was given a size 24, which Mom said felt more comfortable.  She was observed pumping with the size 24 & it continued to be a good fit for her.    Mom encouraged to try More Milk Plus and to continue pumping q3 hrs.  Mom taught hand expression and encouraged to do a couple minutes on each side after pumping.  I am hoping that with the correct flanges and a trial of the herbal galactagogue, that she will be able to regain some of her milk supply.  Mom may also consider an iron supplement (this was mentioned by the OB prior to d/c).   Parents also encouraged not to add EBM to formula, but to save the EBM until it makes a whole bottle.   Parents encouraged to play "tug-of-war" when she is bottle-feeding to improve oral suction.  Mom aware that nutrition is primarily coming from the bottle, but spending time at the breast is also fine.  Parents aware that they no longer have to feed 22 cal formula since baby is over 6 lbs.

## 2014-03-26 ENCOUNTER — Encounter (HOSPITAL_COMMUNITY): Payer: Self-pay | Admitting: Obstetrics

## 2015-06-06 IMAGING — US US OB FOLLOW-UP
1 series · 12 of 28 positions shown · non-contrast
Comparison: none

[Series 1: us ob follow-up · 0.19mm/px · 12 of 75 slices shown]
[im 3/75]
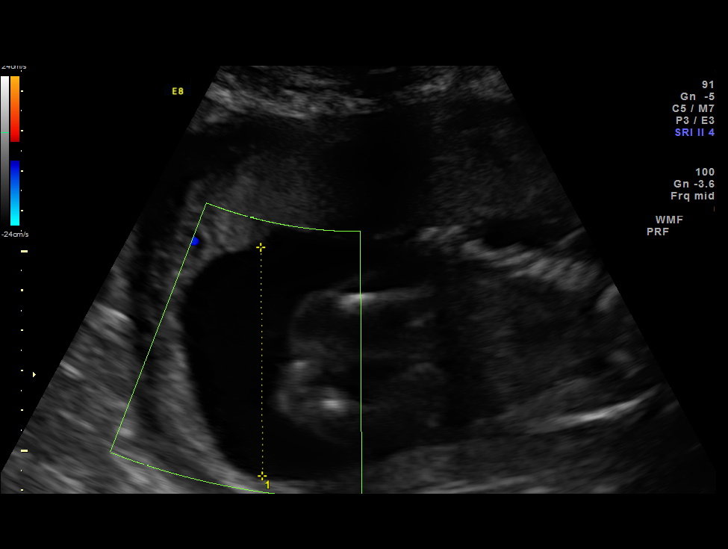
[im 9/75]
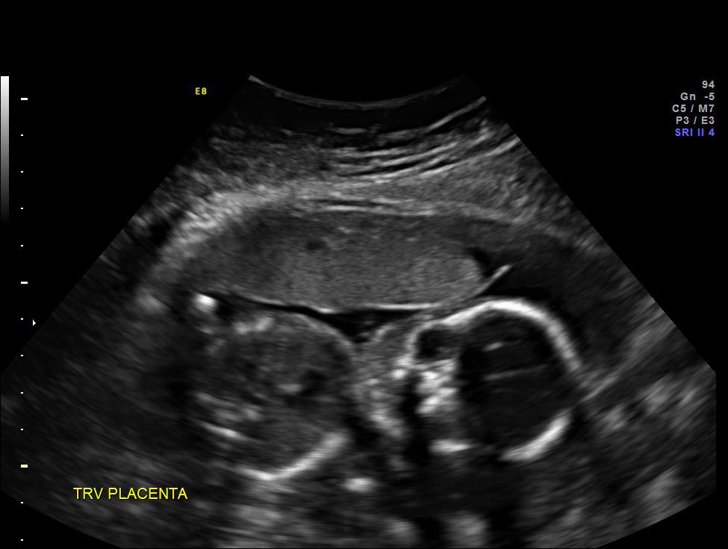
[im 14/75]
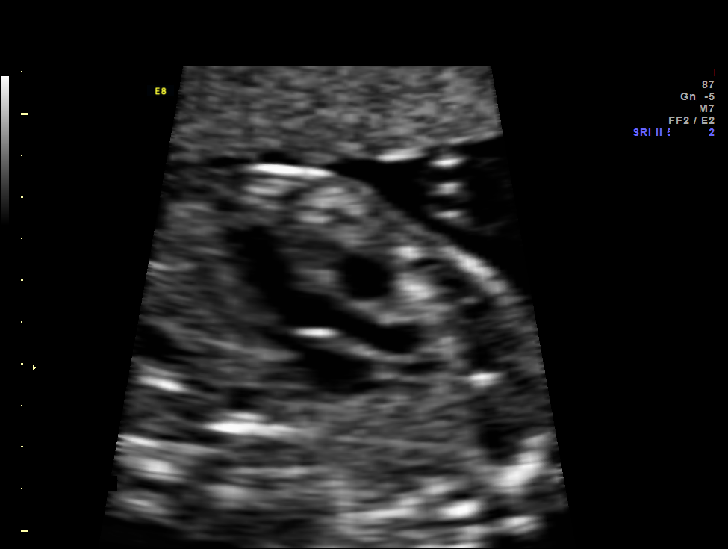
[im 22/75]
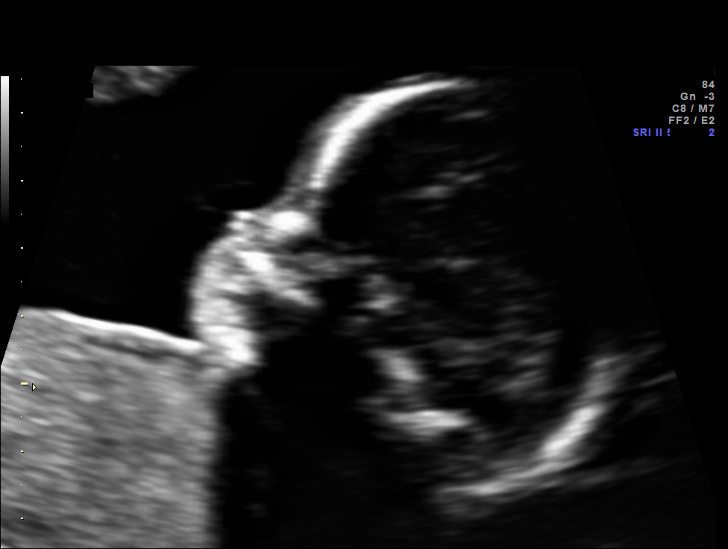
[im 28/75]
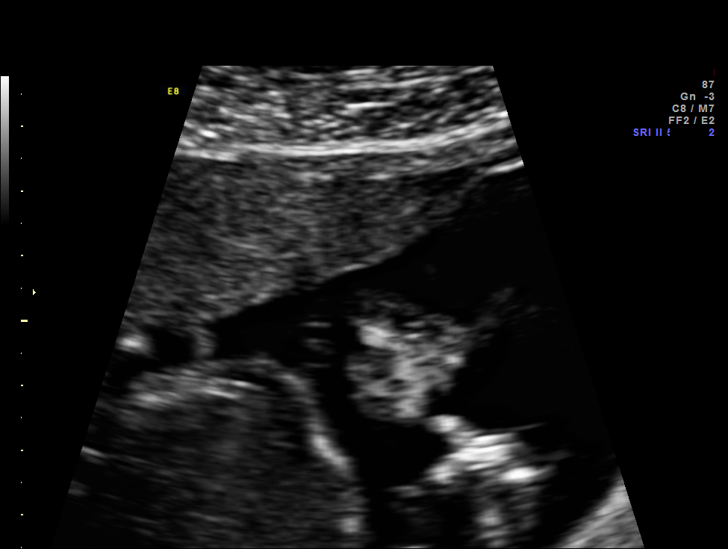
[im 33/75]
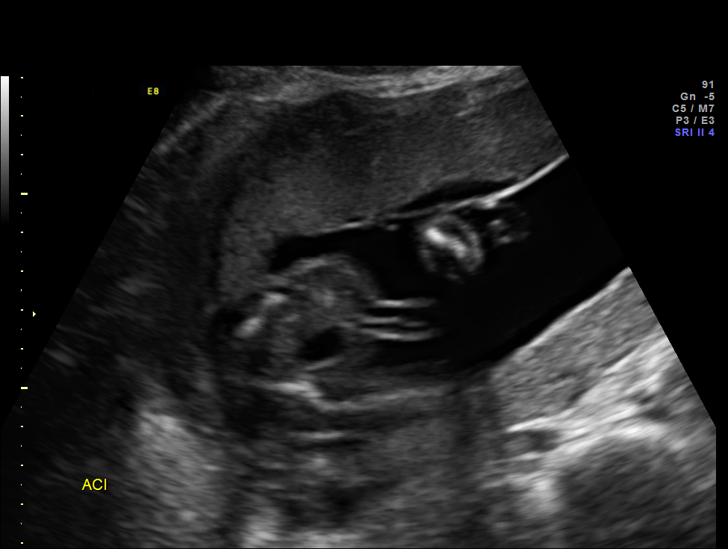
[im 42/75]
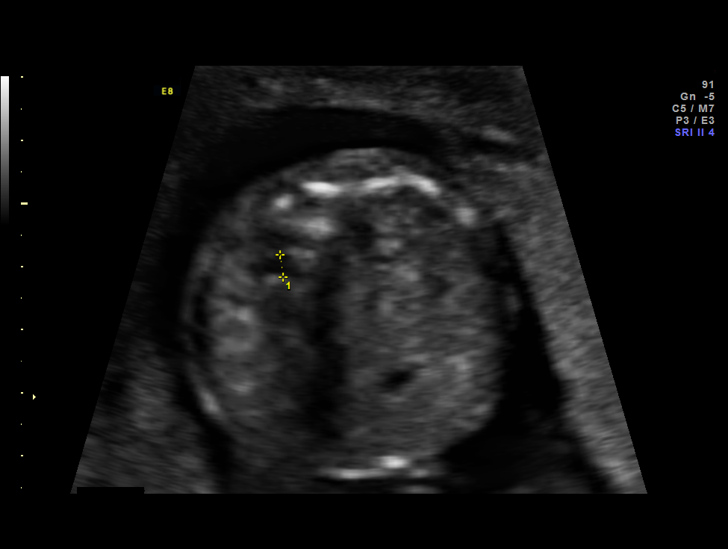
[im 47/75]
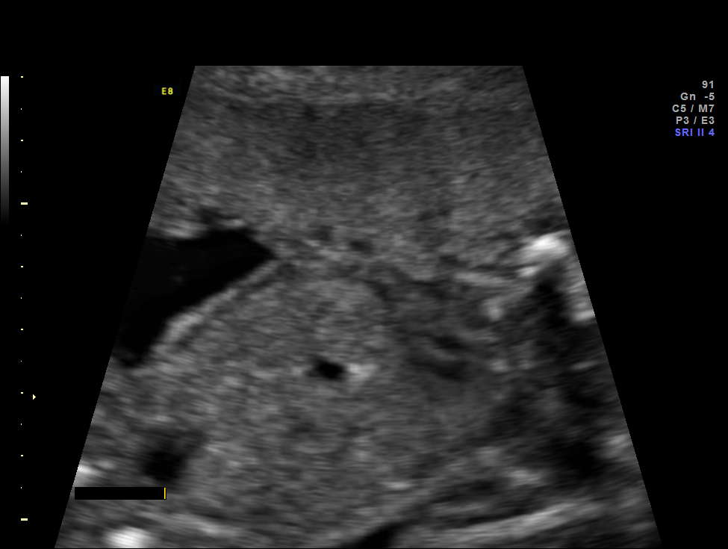
[im 53/75]
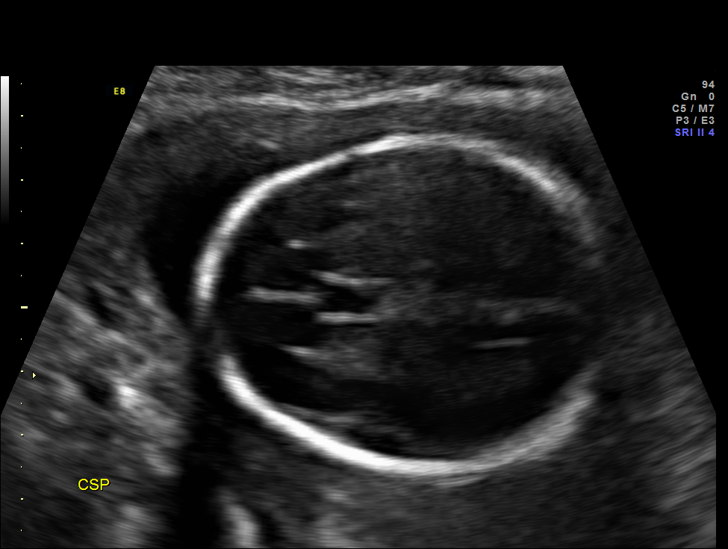
[im 61/75]
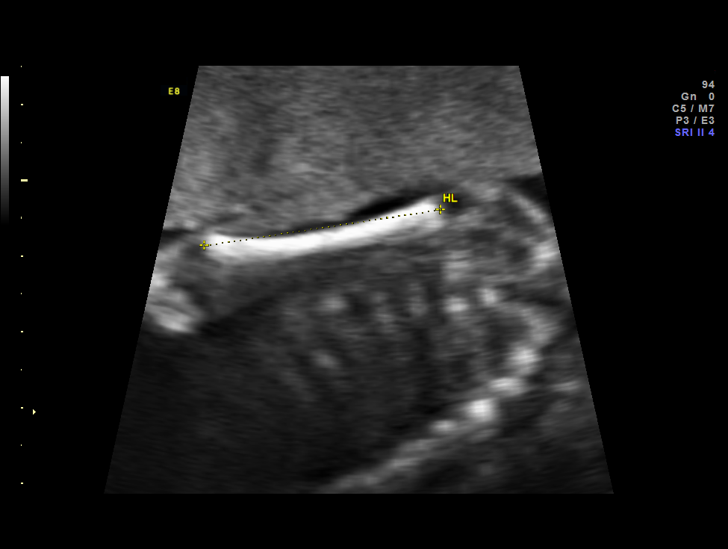
[im 66/75]
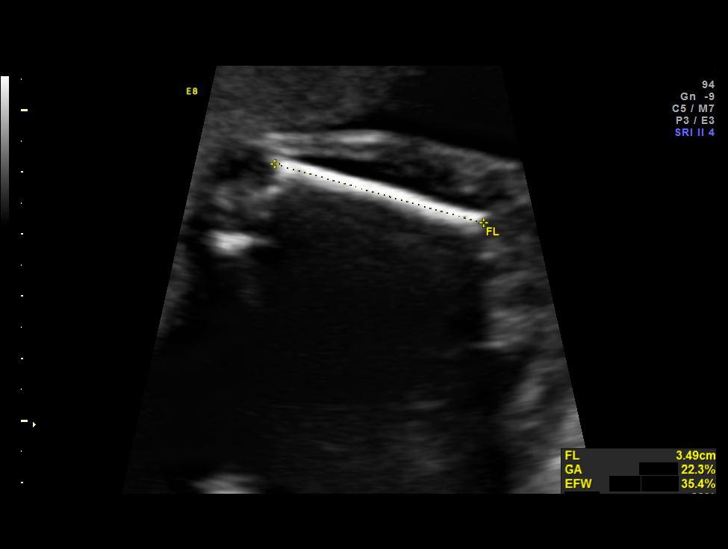
[im 72/75]
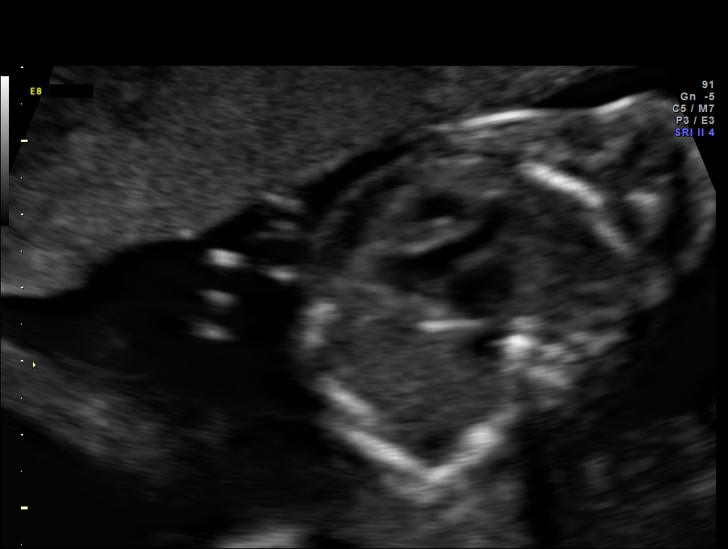

[12 of 28 positions shown; findings below may reference images not displayed]

OBSTETRICS REPORT
                      (Signed Final 11/16/2013 [DATE])

Service(s) Provided

 US OB FOLLOW UP                                       76816.1
Indications

 Follow-up incomplete fetal anatomic evaluation
 Abnormal first trimester screen (NT 4.3mm) - DSR
 [DATE]
 Low risk Informaseq for trisomies, Increased risk
 for XXX
 Advanced maternal age (AMA), Multigravida (37)
 History of cesarean delivery, currently pregnant      654.20,
Fetal Evaluation

 Num Of Fetuses:    1
 Fetal Heart Rate:  157                          bpm
 Cardiac Activity:  Observed
 Presentation:      Cephalic
 Placenta:          Anterior, above cervical os
 P. Cord            Visualized, central
 Insertion:

 Amniotic Fluid
 AFI FV:      Subjectively within normal limits
                                             Larg Pckt:    5.73  cm
Biometry

 BPD:     52.5  mm     G. Age:  22w 0d                CI:        73.87   70 - 86
                                                      FL/HC:      18.2   18.4 -

 HC:       194  mm     G. Age:  21w 4d       42  %    HC/AC:      1.20   1.06 -

 AC:     161.3  mm     G. Age:  21w 2d       32  %    FL/BPD:     67.2   71 - 87
 FL:      35.3  mm     G. Age:  21w 1d       26  %    FL/AC:      21.9   20 - 24
 HUM:     32.4  mm     G. Age:  20w 6d       30  %

 Est. FW:     411  gm    0 lb 14 oz      37  %
Gestational Age

 LMP:           21w 4d        Date:  06/18/13                 EDD:   03/25/14
 U/S Today:     21w 4d                                        EDD:   03/25/14
 Best:          21w 4d     Det. By:  LMP  (06/18/13)          EDD:   03/25/14
Anatomy

 Cranium:          Appears normal         Aortic Arch:      Appears normal
 Fetal Cavum:      Appears normal         Ductal Arch:      Appears normal
 Ventricles:       Appears normal         Diaphragm:        Appears normal
 Choroid Plexus:   Appears normal         Stomach:          Appears normal
 Cerebellum:       Appears normal         Abdomen:          Appears normal
 Posterior Fossa:  Appears normal         Abdominal Wall:   Appears nml (cord
                                                            insert, abd wall)
 Nuchal Fold:      Previously seen        Cord Vessels:     Appears normal (3
                                                            vessel cord)
 Face:             Appears normal         Kidneys:          Appear normal
                   (orbits and profile)
 Lips:             Appears normal         Bladder:          Appears normal
 Heart:            Appears normal         Spine:            Previously seen
                   (4CH, axis, and
                   situs)
 RVOT:             Appears normal         Lower             Previously seen
                                          Extremities:
 LVOT:             Appears normal         Upper             Previously seen
                                          Extremities:

 Other:  Female gender. Heels visualized. 5th digits previously visualized. NB
         visualized.
Cervix Uterus Adnexa

 Cervical Length:    4.37     cm

 Cervix:       Normal appearance by transabdominal scan.
 Uterus:       No abnormality visualized.
 Cul De Sac:   No free fluid seen.
 Left Ovary:    Within normal limits. `
 Right Ovary:   Not visualized.

 Adnexa:     No abnormality visualized.
Impression

 SIUP at 21+4 weeks
 Normal detailed fetal anatomy
 Normal amniotic fluid volume
 Measurements consistent with LMP dating

 Fetal ECHO today
Recommendations

 Follow-up ultrasound for growth in 6 weeks

 questions or concerns.

## 2015-07-18 IMAGING — US US OB FOLLOW-UP
1 series · 12 of 28 positions shown · non-contrast
Comparison: none

[Series 1: us ob follow-up · 0.23mm/px · 12 of 33 slices shown]
[im 2/33]
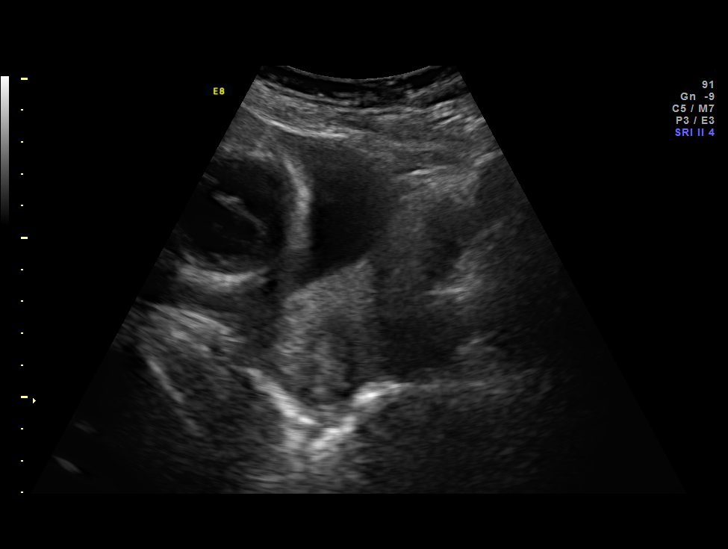
[im 4/33]
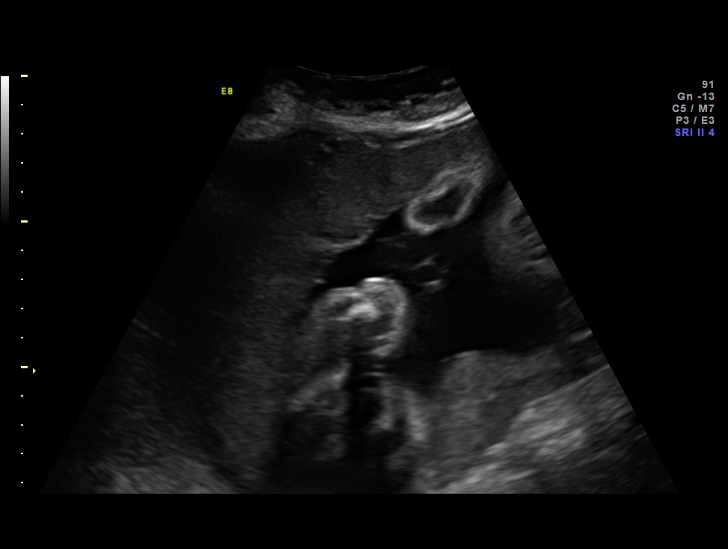
[im 6/33]
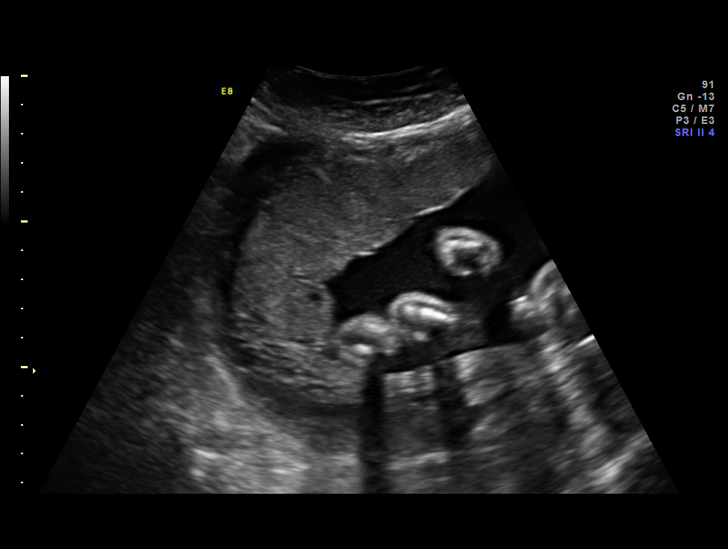
[im 10/33]
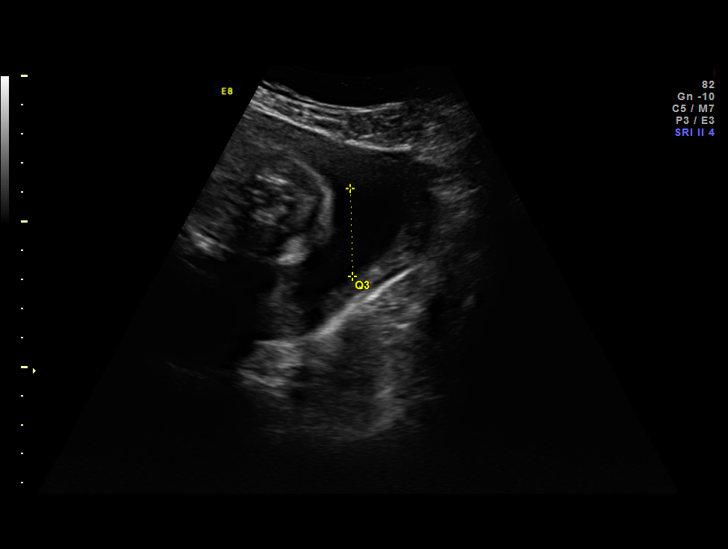
[im 12/33]
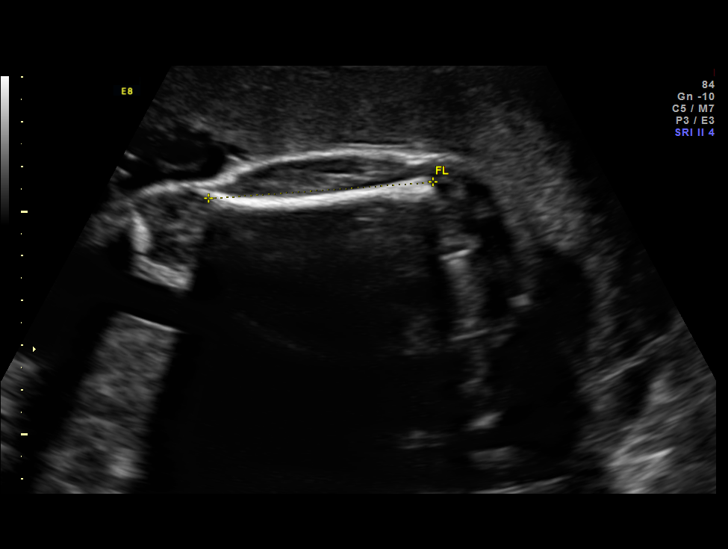
[im 15/33]
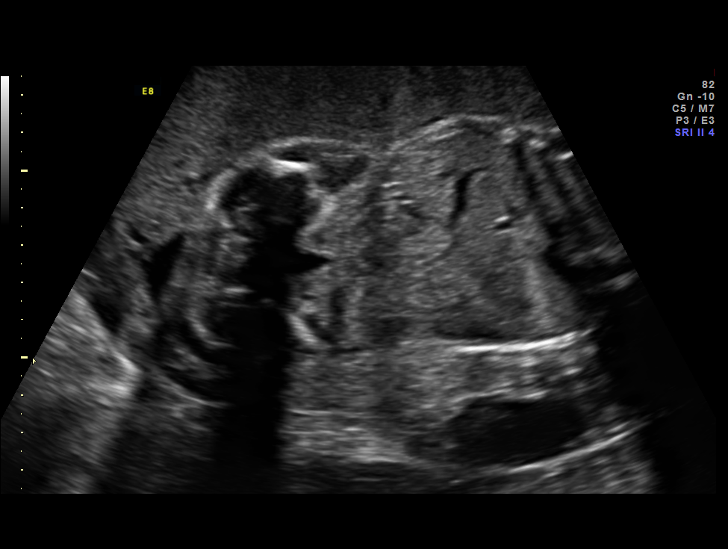
[im 18/33]
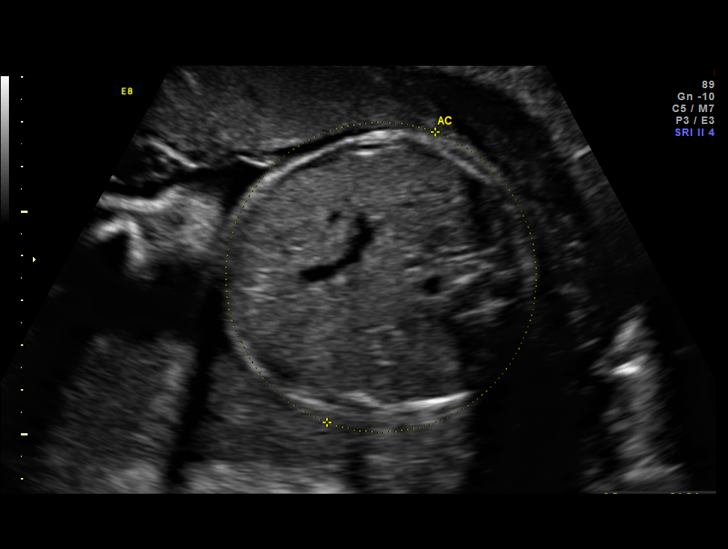
[im 21/33]
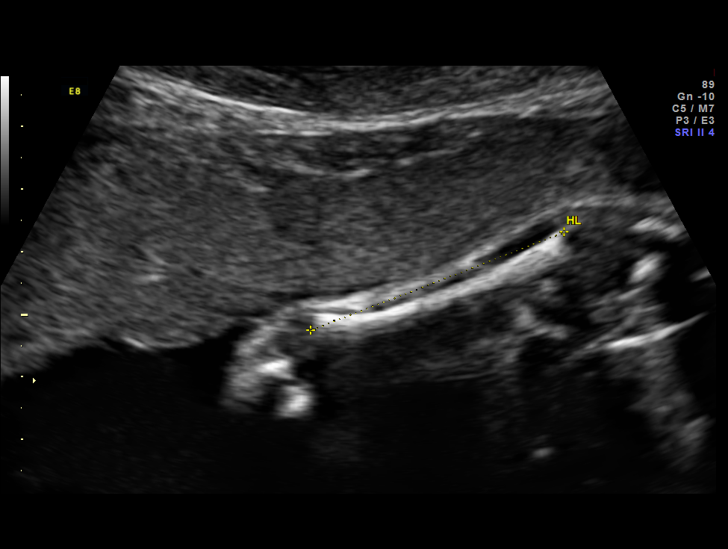
[im 23/33]
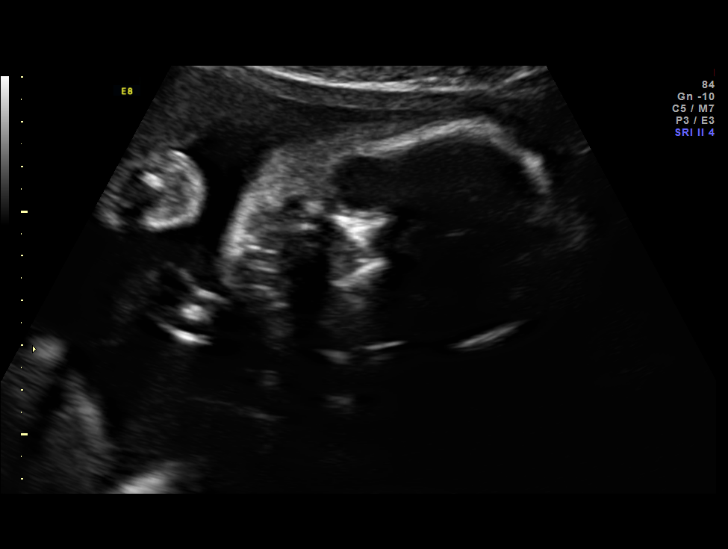
[im 27/33]
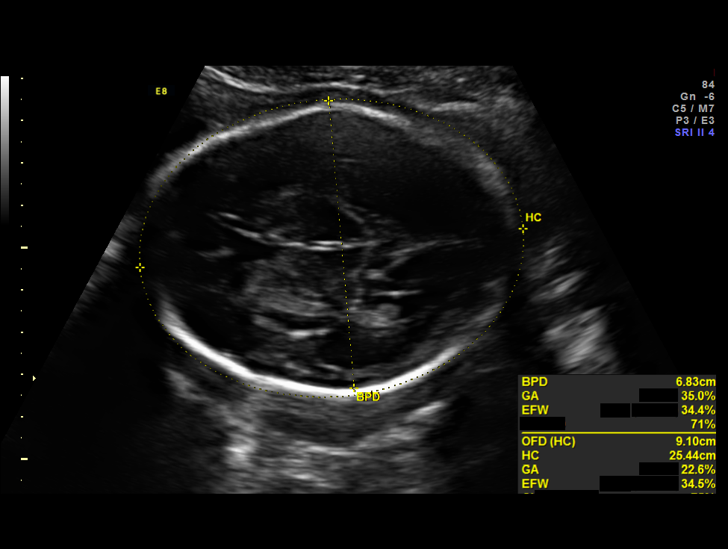
[im 29/33]
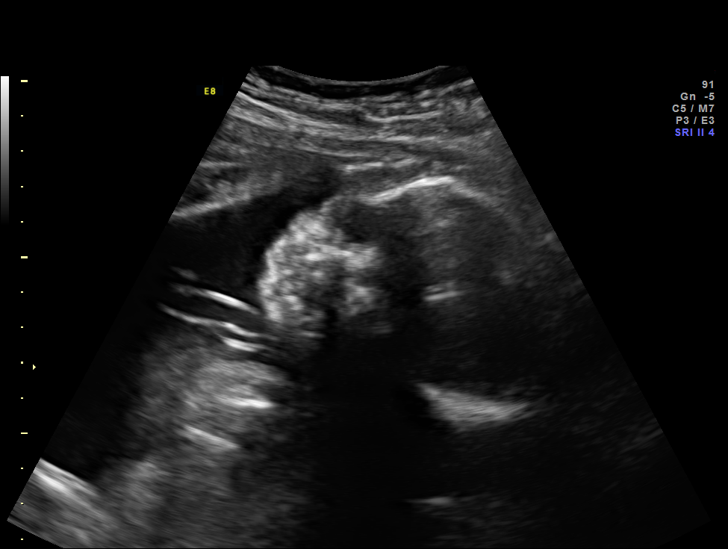
[im 31/33]
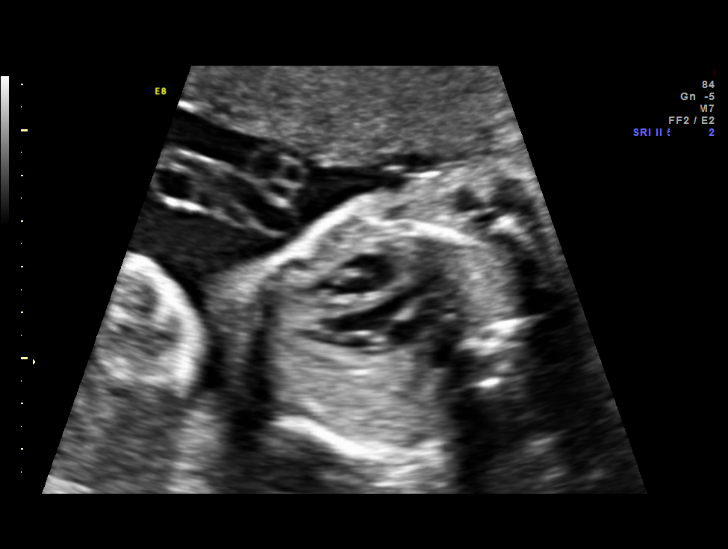

[12 of 28 positions shown; findings below may reference images not displayed]

OBSTETRICS REPORT
                      (Signed Final 12/28/2013 [DATE])

Service(s) Provided

 US OB FOLLOW UP                                       76816.1
Indications

 Abnormal first trimester screen (NT 4.3mm) - DSR
 [DATE]
 Low risk Informaseq for trisomies, Increased risk
 for XXX
 Advanced maternal age (AMA), Multigravida (37)
 History of cesarean delivery, currently pregnant      654.20,
Fetal Evaluation

 Num Of Fetuses:    1
 Fetal Heart Rate:  132                          bpm
 Cardiac Activity:  Observed
 Presentation:      Cephalic
 Placenta:          Anterior, above cervical os
 P. Cord            Previously Visualized
 Insertion:

 Amniotic Fluid
 AFI FV:      Subjectively within normal limits
 AFI Sum:     12.1    cm       28  %Tile     Larg Pckt:    3.18  cm
 RUQ:   2.82    cm   RLQ:    3.08   cm    LUQ:   3.18    cm   LLQ:    3.02   cm
Biometry

 BPD:       68  mm     G. Age:  27w 3d                CI:         75.0   70 - 86
 OFD:     90.7  mm                                    FL/HC:      19.4   18.8 -

 HC:     253.8  mm     G. Age:  27w 4d       22  %    HC/AC:      1.15   1.05 -

 AC:     220.8  mm     G. Age:  26w 3d       15  %    FL/BPD:     72.4   71 - 87
 FL:      49.2  mm     G. Age:  26w 4d       12  %    FL/AC:      22.3   20 - 24
 HUM:     43.3  mm     G. Age:  25w 6d        7  %

 Est. FW:     970  gm      2 lb 2 oz     33  %
Gestational Age

 LMP:           27w 4d        Date:  06/18/13                 EDD:   03/25/14
 U/S Today:     27w 0d                                        EDD:   03/29/14
 Best:          27w 4d     Det. By:  LMP  (06/18/13)          EDD:   03/25/14
Anatomy

 Cranium:          Previously seen        Aortic Arch:      Previously seen
 Fetal Cavum:      Previously seen        Ductal Arch:      Previously seen
 Ventricles:       Appears normal         Diaphragm:        Appears normal
 Choroid Plexus:   Previously seen        Stomach:          Appears normal, left
                                                            sided
 Cerebellum:       Previously seen        Abdomen:          Previously seen
 Posterior Fossa:  Previously seen        Abdominal Wall:   Previously seen
 Nuchal Fold:      Previously seen        Cord Vessels:     Previously seen
 Face:             Orbits and profile     Kidneys:          Appear normal
                   previously seen
 Lips:             Previously seen        Bladder:          Appears normal
 Heart:            Appears normal         Spine:            Previously seen
                   (4CH, axis, and
                   situs)
 RVOT:             Previously seen        Lower             Previously seen
                                          Extremities:
 LVOT:             Previously seen        Upper             Previously seen
                                          Extremities:

 Other:  Female gender. Heels and 5th digit previously seen.
Cervix Uterus Adnexa

 Cervical Length:    3.4      cm

 Cervix:       Appears closed, without funnelling.
Impression

 Single living intrauterine pregnancy at 27 weeks 4 days.
 Appropriate interval fetal growth (33%).
 Normal amniotic fluid volume.
 Normal interval fetal anatomy.
Recommendations

 Recommend follow-up ultrasound examination in 4 weeks.

 questions or concerns.
                Moolman, Klpigbb

## 2015-12-19 ENCOUNTER — Other Ambulatory Visit: Payer: Self-pay | Admitting: General Surgery

## 2016-03-23 ENCOUNTER — Other Ambulatory Visit: Payer: Self-pay | Admitting: General Surgery

## 2016-04-08 ENCOUNTER — Encounter (HOSPITAL_BASED_OUTPATIENT_CLINIC_OR_DEPARTMENT_OTHER): Payer: Self-pay | Admitting: *Deleted

## 2016-04-13 NOTE — H&P (Signed)
Mallory Martin  [facility] Patient #: [id number] DOB: 12/10/1975 Married / Language: English / Race: Black or African American Female       History of Present Illness  The patient is a 40 year old female who presents with an umbilical hernia. This is a 40 year old African American female who returns to see me and is ready to have her umbilical hernia repaired.  She had a lot of pain and protrusion with her umbilical hernia during her last pregnancy. I saw her on July 30, 2014. She was postpartum at that time and feeling better. She has had no prior surgery of the umbilicus and the laparoscopic procedures. She has had 2 C-sections.  I found a small umbilical hernia. She stated that she wanted to go home and think about when she wanted to do this. She was lost to follow-up. She is now thinking about getting pregnant again almost have the umbilical hernia repaired so that she doesn't have to endure the same pain she had last time. I told her that was reasonable.  We are going to schedule her for open repair of umbilical hernia with mesh. I've explained the indications, details, techniques, and risks of the surgery with her. She is aware the risk of bleeding, infection, recurrence, nerve damage with chronic pain or numbness, injury to the intestine with major reconstructive surgery. She understands all these issues. All of her questions were answered. She agrees with this plan. I gave her another patient infection booklet and drew pictures and pointed out the important areas.  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 I told her we would try.   Allergies  Advil *ANALGESICS - ANTI-INFLAMMATORY* Swelling. Excedrin *ANALGESICS - NonNarcotic* OxyCODONE HCl (Abuse Deter) *ANALGESICS - OPIOID* Vicodin ES *ANALGESICS - OPIOID* Nausea.  Medication History  PNV Prenatal Plus  Multivitamin (27-1MG  Tablet, Oral) Active. Medications Reconciled  Vitals  Weight: 176 lb Height: 62in Body Surface Area: 1.81 m Body Mass Index: 32.19 kg/m  Temp.: 38F(Temporal)  Pulse: 75 (Regular)  BP: 122/76 (Sitting, Left Arm, Standard)       Physical Exam  General Mental Status-Alert. General Appearance-Not in acute distress. Build & Nutrition-Well nourished. Posture-Normal posture. Gait-Normal.  Head and Neck Head-normocephalic, atraumatic with no lesions or palpable masses. Trachea-midline. Thyroid Gland Characteristics - normal size and consistency and no palpable nodules.  Chest and Lung Exam Chest and lung exam reveals -on auscultation, normal breath sounds, no adventitious sounds and normal vocal resonance.  Cardiovascular Cardiovascular examination reveals -normal heart sounds, regular rate and rhythm with no murmurs and femoral artery auscultation bilaterally reveals normal pulses, no bruits, no thrills.  Abdomen Note: Slightly protuberant. BMI 32. She has a partially incarcerated umbilical hernia. There is some fatty tissue that I can only partially reduce the protrusion at the upper rim of the umbilicus. No evidence of epigastric hernia. Skin healthy. Soft. No other scars or hernias. No organomegaly or mass. Healed Pfannenstiel incision.   Neurologic Neurologic evaluation reveals -alert and oriented x 3 with no impairment of recent or remote memory, normal attention span and ability to concentrate, normal sensation and normal coordination.  Musculoskeletal Normal Exam - Bilateral-Upper Extremity Strength Normal and Lower Extremity Strength Normal.    Assessment & Plan  UMBILICAL HERNIA WITHOUT OBSTRUCTION AND WITHOUT GANGRENE (K42.9)   You have an umbilical hernia. It is not much larger than  last year but I cannot completely reduce the fatty tissue  At your request, he will be scheduled for repair of  your umbilical hernia with mesh We have discussed the indications, techniques, and risk of this surgery in detail Please read the printed information that I gave you We will try to schedule this at your convenience.  HISTORY OF C-SECTION (Z61.096(Z98.891) BMI 33.0-33.9,ADULT (Z68.33) ASTHMA, MILD (J45.998)     Shahid Flori M. Derrell LollingIngram, M.D., [facility] General and Minimally invasive Surgery Breast and Colorectal Surgery Office:   [phone] Pager:   [phone]

## 2016-04-14 ENCOUNTER — Encounter (HOSPITAL_BASED_OUTPATIENT_CLINIC_OR_DEPARTMENT_OTHER): Payer: Self-pay | Admitting: *Deleted

## 2016-04-14 ENCOUNTER — Ambulatory Visit (HOSPITAL_BASED_OUTPATIENT_CLINIC_OR_DEPARTMENT_OTHER): Payer: BC Managed Care – PPO | Admitting: Certified Registered"

## 2016-04-14 ENCOUNTER — Encounter (HOSPITAL_BASED_OUTPATIENT_CLINIC_OR_DEPARTMENT_OTHER)
Admission: RE | Disposition: A | Discharge status: Home / Self Care | Payer: Self-pay | Source: Ambulatory Visit | Attending: General Surgery

## 2016-04-14 ENCOUNTER — Ambulatory Visit (HOSPITAL_BASED_OUTPATIENT_CLINIC_OR_DEPARTMENT_OTHER)
Admission: RE | Admit: 2016-04-14 | Discharge: 2016-04-14 | Disposition: A | Discharge status: Home / Self Care | Payer: BC Managed Care – PPO | Source: Ambulatory Visit | Attending: General Surgery | Admitting: General Surgery

## 2016-04-14 DIAGNOSIS — K420 Umbilical hernia with obstruction, without gangrene: Secondary | ICD-10-CM | POA: Diagnosis present

## 2016-04-14 DIAGNOSIS — J45909 Unspecified asthma, uncomplicated: Secondary | ICD-10-CM | POA: Diagnosis not present

## 2016-04-14 HISTORY — PX: INSERTION OF MESH: SHX5868

## 2016-04-14 HISTORY — PX: UMBILICAL HERNIA REPAIR: SHX196

## 2016-04-14 SURGERY — REPAIR, HERNIA, UMBILICAL, ADULT
Anesthesia: General | Site: Abdomen

## 2016-04-14 MED ORDER — METHYLENE BLUE 0.5 % INJ SOLN
INTRAVENOUS | Status: AC
  Filled 2016-04-14: qty 10

## 2016-04-14 MED ORDER — HEPARIN (PORCINE) IN NACL 2-0.9 UNIT/ML-% IJ SOLN
INTRAMUSCULAR | Status: AC
Start: 2016-04-14 — End: 2016-04-14
  Filled 2016-04-14: qty 500

## 2016-04-14 MED ORDER — GABAPENTIN 300 MG PO CAPS
300.0000 mg | ORAL_CAPSULE | ORAL | Status: AC
  Administered 2016-04-14: 300 mg via ORAL

## 2016-04-14 MED ORDER — PROPOFOL 500 MG/50ML IV EMUL
INTRAVENOUS | Status: AC
  Filled 2016-04-14: qty 50

## 2016-04-14 MED ORDER — ONDANSETRON HCL 4 MG/2ML IJ SOLN: 4.0000 mg | Freq: Once | INTRAMUSCULAR | Status: DC | PRN

## 2016-04-14 MED ORDER — BUPIVACAINE HCL (PF) 0.5 % IJ SOLN
INTRAMUSCULAR | Status: AC
  Filled 2016-04-14: qty 30

## 2016-04-14 MED ORDER — MEPERIDINE HCL 25 MG/ML IJ SOLN: 6.2500 mg | INTRAMUSCULAR | Status: DC | PRN

## 2016-04-14 MED ORDER — SODIUM BICARBONATE 4 % IV SOLN
INTRAVENOUS | Status: AC
  Filled 2016-04-14: qty 5

## 2016-04-14 MED ORDER — IOPAMIDOL (ISOVUE-300) INJECTION 61%
INTRAVENOUS | Status: AC
  Filled 2016-04-14: qty 50

## 2016-04-14 MED ORDER — SUGAMMADEX SODIUM 200 MG/2ML IV SOLN
INTRAVENOUS | Status: AC
  Filled 2016-04-14: qty 2

## 2016-04-14 MED ORDER — CEFAZOLIN SODIUM-DEXTROSE 2-4 GM/100ML-% IV SOLN
INTRAVENOUS | Status: AC
  Filled 2016-04-14: qty 100

## 2016-04-14 MED ORDER — HYDROMORPHONE HCL 1 MG/ML IJ SOLN
0.2500 mg | INTRAMUSCULAR | Status: DC | PRN
  Administered 2016-04-14 (×3): 0.5 mg via INTRAVENOUS

## 2016-04-14 MED ORDER — LIDOCAINE HCL (CARDIAC) 20 MG/ML IV SOLN
INTRAVENOUS | Status: DC | PRN
  Administered 2016-04-14: 100 mg via INTRAVENOUS

## 2016-04-14 MED ORDER — ROCURONIUM BROMIDE 10 MG/ML (PF) SYRINGE
PREFILLED_SYRINGE | INTRAVENOUS | Status: AC
  Filled 2016-04-14: qty 10

## 2016-04-14 MED ORDER — FENTANYL CITRATE (PF) 100 MCG/2ML IJ SOLN
INTRAMUSCULAR | Status: AC
  Filled 2016-04-14: qty 2

## 2016-04-14 MED ORDER — SUGAMMADEX SODIUM 200 MG/2ML IV SOLN
INTRAVENOUS | Status: DC | PRN
  Administered 2016-04-14: 200 mg via INTRAVENOUS

## 2016-04-14 MED ORDER — FENTANYL CITRATE (PF) 100 MCG/2ML IJ SOLN
50.0000 ug | INTRAMUSCULAR | Status: DC | PRN
  Administered 2016-04-14: 100 ug via INTRAVENOUS

## 2016-04-14 MED ORDER — PROPOFOL 10 MG/ML IV BOLUS
INTRAVENOUS | Status: DC | PRN
  Administered 2016-04-14: 150 mg via INTRAVENOUS

## 2016-04-14 MED ORDER — HEPARIN SOD (PORK) LOCK FLUSH 100 UNIT/ML IV SOLN
INTRAVENOUS | Status: AC
  Filled 2016-04-14: qty 5

## 2016-04-14 MED ORDER — CEFAZOLIN SODIUM-DEXTROSE 2-4 GM/100ML-% IV SOLN
2.0000 g | INTRAVENOUS | Status: AC
  Administered 2016-04-14: 2 g via INTRAVENOUS

## 2016-04-14 MED ORDER — LACTATED RINGERS IV SOLN
INTRAVENOUS | Status: DC
  Administered 2016-04-14 (×3): via INTRAVENOUS

## 2016-04-14 MED ORDER — MIDAZOLAM HCL 2 MG/2ML IJ SOLN
INTRAMUSCULAR | Status: AC
  Filled 2016-04-14: qty 2

## 2016-04-14 MED ORDER — LIDOCAINE HCL (PF) 1 % IJ SOLN
INTRAMUSCULAR | Status: AC
  Filled 2016-04-14: qty 30

## 2016-04-14 MED ORDER — 0.9 % SODIUM CHLORIDE (POUR BTL) OPTIME
TOPICAL | Status: DC | PRN
  Administered 2016-04-14: 1000 mL

## 2016-04-14 MED ORDER — MIDAZOLAM HCL 2 MG/2ML IJ SOLN
1.0000 mg | INTRAMUSCULAR | Status: DC | PRN
  Administered 2016-04-14: 2 mg via INTRAVENOUS

## 2016-04-14 MED ORDER — DEXAMETHASONE SODIUM PHOSPHATE 10 MG/ML IJ SOLN
INTRAMUSCULAR | Status: AC
  Filled 2016-04-14: qty 1

## 2016-04-14 MED ORDER — LIDOCAINE 2% (20 MG/ML) 5 ML SYRINGE
INTRAMUSCULAR | Status: AC
  Filled 2016-04-14: qty 5

## 2016-04-14 MED ORDER — ONDANSETRON 4 MG PO TBDP
ORAL_TABLET | ORAL | Status: AC
  Filled 2016-04-14: qty 1

## 2016-04-14 MED ORDER — HYDROMORPHONE HCL 1 MG/ML IJ SOLN
INTRAMUSCULAR | Status: AC
  Filled 2016-04-14: qty 1

## 2016-04-14 MED ORDER — ATROPINE SULFATE 0.4 MG/ML IJ SOLN
INTRAMUSCULAR | Status: AC
  Filled 2016-04-14: qty 1

## 2016-04-14 MED ORDER — SUCCINYLCHOLINE CHLORIDE 200 MG/10ML IV SOSY
PREFILLED_SYRINGE | INTRAVENOUS | Status: AC
  Filled 2016-04-14: qty 10

## 2016-04-14 MED ORDER — CHLORHEXIDINE GLUCONATE CLOTH 2 % EX PADS: 6.0000 | MEDICATED_PAD | Freq: Once | CUTANEOUS | Status: DC

## 2016-04-14 MED ORDER — DEXAMETHASONE SODIUM PHOSPHATE 4 MG/ML IJ SOLN
INTRAMUSCULAR | Status: DC | PRN
  Administered 2016-04-14: 10 mg via INTRAVENOUS

## 2016-04-14 MED ORDER — BUPIVACAINE HCL 0.5 % IJ SOLN
INTRAMUSCULAR | Status: DC | PRN
  Administered 2016-04-14: 10 mL

## 2016-04-14 MED ORDER — ONDANSETRON HCL 4 MG/2ML IJ SOLN
INTRAMUSCULAR | Status: DC | PRN
  Administered 2016-04-14: 4 mg via INTRAVENOUS

## 2016-04-14 MED ORDER — OXYCODONE HCL 5 MG PO TABS: 5.0000 mg | ORAL_TABLET | Freq: Four times a day (QID) | ORAL | 0 refills | Status: DC | PRN

## 2016-04-14 MED ORDER — ONDANSETRON HCL 4 MG/2ML IJ SOLN
INTRAMUSCULAR | Status: AC
  Filled 2016-04-14: qty 2

## 2016-04-14 MED ORDER — SCOPOLAMINE 1 MG/3DAYS TD PT72: 1.0000 | MEDICATED_PATCH | Freq: Once | TRANSDERMAL | Status: DC | PRN

## 2016-04-14 MED ORDER — GABAPENTIN 300 MG PO CAPS
ORAL_CAPSULE | ORAL | Status: AC
  Filled 2016-04-14: qty 1

## 2016-04-14 MED ORDER — SODIUM CHLORIDE 0.9 % IJ SOLN
INTRAMUSCULAR | Status: AC
  Filled 2016-04-14: qty 10

## 2016-04-14 MED ORDER — ROCURONIUM BROMIDE 100 MG/10ML IV SOLN
INTRAVENOUS | Status: DC | PRN
  Administered 2016-04-14: 40 mg via INTRAVENOUS

## 2016-04-14 MED ORDER — ONDANSETRON 4 MG PO TBDP
4.0000 mg | ORAL_TABLET | Freq: Once | ORAL | Status: AC
  Administered 2016-04-14: 4 mg via ORAL

## 2016-04-14 SURGICAL SUPPLY — 76 items
ADH SKN CLS APL DERMABOND .7 (GAUZE/BANDAGES/DRESSINGS)
APL SKNCLS STERI-STRIP NONHPOA (GAUZE/BANDAGES/DRESSINGS)
APPLICATOR COTTON TIP 6IN STRL (MISCELLANEOUS) ×3 IMPLANT
BENZOIN TINCTURE PRP APPL 2/3 (GAUZE/BANDAGES/DRESSINGS) IMPLANT
BLADE CLIPPER SURG (BLADE) IMPLANT
BLADE HEX COATED 2.75 (ELECTRODE) ×3 IMPLANT
BLADE SURG 10 STRL SS (BLADE) ×3 IMPLANT
BLADE SURG 15 STRL LF DISP TIS (BLADE) ×1 IMPLANT
BLADE SURG 15 STRL SS (BLADE) ×3
CANISTER SUCT 1200ML W/VALVE (MISCELLANEOUS) ×3 IMPLANT
CHLORAPREP W/TINT 26ML (MISCELLANEOUS) ×3 IMPLANT
CLOSURE WOUND 1/2 X4 (GAUZE/BANDAGES/DRESSINGS)
CLOSURE WOUND 1/4X4 (GAUZE/BANDAGES/DRESSINGS)
COVER BACK TABLE 60X90IN (DRAPES) ×6 IMPLANT
COVER MAYO STAND STRL (DRAPES) ×3 IMPLANT
DECANTER SPIKE VIAL GLASS SM (MISCELLANEOUS) ×3 IMPLANT
DERMABOND ADVANCED (GAUZE/BANDAGES/DRESSINGS)
DERMABOND ADVANCED .7 DNX12 (GAUZE/BANDAGES/DRESSINGS) IMPLANT
DRAIN PENROSE 1/2X12 LTX STRL (WOUND CARE) ×3 IMPLANT
DRAPE LAPAROTOMY 100X72 PEDS (DRAPES) ×3 IMPLANT
DRAPE LAPAROTOMY TRNSV 102X78 (DRAPE) IMPLANT
DRAPE UTILITY XL STRL (DRAPES) ×3 IMPLANT
ELECT REM PT RETURN 9FT ADLT (ELECTROSURGICAL) ×3
ELECTRODE REM PT RTRN 9FT ADLT (ELECTROSURGICAL) ×1 IMPLANT
GLOVE EUDERMIC 7 POWDERFREE (GLOVE) ×3 IMPLANT
GOWN STRL REUS W/ TWL LRG LVL3 (GOWN DISPOSABLE) ×1 IMPLANT
GOWN STRL REUS W/ TWL XL LVL3 (GOWN DISPOSABLE) ×1 IMPLANT
GOWN STRL REUS W/TWL LRG LVL3 (GOWN DISPOSABLE) ×3
GOWN STRL REUS W/TWL XL LVL3 (GOWN DISPOSABLE) ×3
MESH VENTRALEX ST 1-7/10 CRC S (Mesh General) ×2 IMPLANT
NDL HYPO 25X1 1.5 SAFETY (NEEDLE) ×1 IMPLANT
NEEDLE HYPO 22GX1.5 SAFETY (NEEDLE) ×3 IMPLANT
NEEDLE HYPO 25X1 1.5 SAFETY (NEEDLE) ×3 IMPLANT
NS IRRIG 1000ML POUR BTL (IV SOLUTION) ×3 IMPLANT
PACK BASIN DAY SURGERY FS (CUSTOM PROCEDURE TRAY) ×3 IMPLANT
PENCIL BUTTON HOLSTER BLD 10FT (ELECTRODE) ×3 IMPLANT
SLEEVE SCD COMPRESS KNEE MED (MISCELLANEOUS) IMPLANT
SPONGE GAUZE 4X4 12PLY STER LF (GAUZE/BANDAGES/DRESSINGS) IMPLANT
SPONGE LAP 18X18 X RAY DECT (DISPOSABLE) ×3 IMPLANT
SPONGE LAP 4X18 X RAY DECT (DISPOSABLE) ×3 IMPLANT
STAPLER VISISTAT 35W (STAPLE) IMPLANT
STRIP CLOSURE SKIN 1/2X4 (GAUZE/BANDAGES/DRESSINGS) IMPLANT
STRIP CLOSURE SKIN 1/4X4 (GAUZE/BANDAGES/DRESSINGS) IMPLANT
SUT MNCRL AB 4-0 PS2 18 (SUTURE) ×3 IMPLANT
SUT NOVA 0 T19/GS 22DT (SUTURE) IMPLANT
SUT NOVA NAB DX-16 0-1 5-0 T12 (SUTURE) ×4 IMPLANT
SUT PDS AB 0 CT 36 (SUTURE) IMPLANT
SUT PROLENE 0 CT 1 CR/8 (SUTURE) IMPLANT
SUT PROLENE 0 CT 2 (SUTURE) IMPLANT
SUT PROLENE 1 CT (SUTURE) IMPLANT
SUT PROLENE 2 0 CT2 30 (SUTURE) ×6 IMPLANT
SUT SILK 2 0 SH (SUTURE) IMPLANT
SUT SILK 2 0 TIES 17X18 (SUTURE) ×3
SUT SILK 2-0 18XBRD TIE BLK (SUTURE) ×1 IMPLANT
SUT SILK 3 0 SH 30 (SUTURE) IMPLANT
SUT VIC AB 2-0 CT1 27 (SUTURE)
SUT VIC AB 2-0 CT1 TAPERPNT 27 (SUTURE) IMPLANT
SUT VIC AB 2-0 SH 27 (SUTURE) ×3
SUT VIC AB 2-0 SH 27XBRD (SUTURE) ×1 IMPLANT
SUT VIC AB 3-0 54X BRD REEL (SUTURE) IMPLANT
SUT VIC AB 3-0 BRD 54 (SUTURE)
SUT VIC AB 3-0 FS2 27 (SUTURE) IMPLANT
SUT VIC AB 3-0 SH 27 (SUTURE) ×3
SUT VIC AB 3-0 SH 27X BRD (SUTURE) ×1 IMPLANT
SUT VICRYL 3-0 CR8 SH (SUTURE) ×3 IMPLANT
SUT VICRYL 4-0 PS2 18IN ABS (SUTURE) ×3 IMPLANT
SUT VICRYL AB 2 0 TIE (SUTURE) IMPLANT
SUT VICRYL AB 2 0 TIES (SUTURE)
SYR 10ML LL (SYRINGE) ×3 IMPLANT
SYR BULB 3OZ (MISCELLANEOUS) IMPLANT
TOWEL OR 17X24 6PK STRL BLUE (TOWEL DISPOSABLE) ×6 IMPLANT
TOWEL OR NON WOVEN STRL DISP B (DISPOSABLE) ×3 IMPLANT
TRAY DSU PREP LF (CUSTOM PROCEDURE TRAY) ×3 IMPLANT
TUBE CONNECTING 20'X1/4 (TUBING) ×1
TUBE CONNECTING 20X1/4 (TUBING) ×2 IMPLANT
YANKAUER SUCT BULB TIP NO VENT (SUCTIONS) ×3 IMPLANT

## 2016-04-14 NOTE — Interval H&P Note (Signed)
History and Physical Interval Note:  04/14/2016 7:12 AM  Mallory GrumblesFioanna S Mikolajczak  has presented today for surgery, with the diagnosis of UMBILICAL HERNIA  The various methods of treatment have been discussed with the patient and family. After consideration of risks, benefits and other options for treatment, the patient has consented to  Procedure(s): OPEN REPAIR UMBILICAL HERNIA WITH MESH (N/A) INSERTION OF MESH (N/A) as a surgical intervention .  The patient's history has been reviewed, patient examined, no change in status, stable for surgery.  I have reviewed the patient's chart and labs.  Questions were answered to the patient's satisfaction.     Ernestene MentionINGRAM,Martinez Boxx M

## 2016-04-14 NOTE — Op Note (Signed)
Patient Name:           Mallory Martin   Date of Surgery:        04/14/2016  Pre op Diagnosis:      Incarcerated umbilical hernia  Post op Diagnosis:    Incarcerated umbilical hernia  Procedure:                 Open repair incarcerated umbilical hernia with 4.3 cm diameter ventraLex mesh disc  Surgeon:                     Angelia MouldHaywood M. Derrell LollingIngram, M.D., FACS  Assistant:                      OR staff   Indication for Assistant: N/A  Operative Indications:      This is a 40 year old African American female who returns to see me and is ready to have her umbilical hernia repaired.      She had a lot of pain and protrusion with her umbilical hernia during her last pregnancy. I saw her on July 30, 2014. She was postpartum at that time and feeling better. She has had no prior surgery of the umbilicus and the laparoscopic procedures. She has had 2 C-sections.      I found a small umbilical hernia. She stated that she wanted to go home and think about when she wanted to do this. She was lost to follow-up. She is now thinking about getting pregnant again almost have the umbilical hernia repaired so that she doesn't have to endure the same pain she had last time. I told her that was reasonable.  She has an incarcerated umbilical hernia that protrudes at the upper rim of the umbilicus.  No evidence of epigastric hernia.      We are going to schedule her for open repair of umbilical hernia with mesh. I've explained the indications, details, techniques, and risks of the surgery with her. She is aware the risk of bleeding, infection, recurrence, nerve damage with chronic pain or numbness, injury to the intestine with major reconstructive surgery. She understands all these issues. All of her questions were answered. She agrees with this plan. I gave her another patient infection booklet and drew pictures and pointed out the important areas.  Operative Findings:       There were actually 2 hernia  defects.  One exactly at the umbilical stalk and one about 1 cm to the lateral right side.  There was incarcerated omentum in both of these.  We connected the 2 small defects and repaired it is a single defect with a mesh disc.  Procedure in Detail:          Following the induction of general endotracheal anesthesia the patient's abdomen was prepped and draped in a sterile fashion, intravenous antibiotic given, surgical timeout performed, and 0.5% Marcaine with epinephrine used as a local infiltration anesthetic.  A curvilinear transverse incision was made at the lower rim of the umbilicus.  Dissection was carried down to the abdominal wall fascia around the stalk of the umbilicus.  The umbilicus was dissected away from the fascia revealing one of the defects.  We further dissected and found a second defect to the right.  We opened up the fascia to connect the defects.  We resected a little bit of the omentum and dropped  that back in.  I passed my finger into the preperitoneal space and felt no  other defects.  The mesh disc was brought to the operative field.  It was sutured in place with 4 interrupted mattress sutures of #1 Novafil.  The sutures were passed down through the fascia under direct vision, through the edge of the mesh and then back up through the fascia.  After all 4 sutures were placed the mesh was folded and inserted into the preperitoneal space and we lifted up and it deployed nicely without redundancy.  All 4 sutures were tied.  I inspected the repair there was no entrapped material.  The wound was irrigated and the fascia overlying the mesh was closed with several interrupted sutures of #1 Novafil that repairing the fascia transversely.  The umbilicus was tacked back down to the fascia with 3-0 Vicryl sutures.  Subcutaneous tissue was closed with 3-0 Vicryl sutures and skin closed with a running subcuticular suture of 4-0 Monocryl and Dermabond.  The patient tolerated the procedure well was  taken to PACU in stable condition.  EBL 10 mL.  Counts correct.  Complications none.     Angelia MouldHaywood M. Derrell LollingIngram, M.D., FACS General and Minimally Invasive Surgery Breast and Colorectal Surgery  04/14/2016 8:32 AM

## 2016-04-14 NOTE — Anesthesia Procedure Notes (Signed)
Procedure Name: Intubation Date/Time: 04/14/2016 7:34 AM Performed by: Curly ShoresRAFT, Mavric Cortright W Pre-anesthesia Checklist: Patient identified, Emergency Drugs available, Suction available and Patient being monitored Patient Re-evaluated:Patient Re-evaluated prior to inductionOxygen Delivery Method: Circle system utilized Preoxygenation: Pre-oxygenation with 100% oxygen Intubation Type: IV induction Ventilation: Mask ventilation without difficulty Laryngoscope Size: Mac and 3 Grade View: Grade II Tube type: Oral Tube size: 7.0 mm Number of attempts: 2 (Unable to visualize cords, + ETT by Dr. Michelle Piperssey) Airway Equipment and Method: Stylet and Oral airway Placement Confirmation: ETT inserted through vocal cords under direct vision,  positive ETCO2 and breath sounds checked- equal and bilateral Secured at: 21 cm Tube secured with: Tape Dental Injury: Teeth and Oropharynx as per pre-operative assessment

## 2016-04-14 NOTE — Discharge Instructions (Signed)
[facility]  UMBILICAL  HERNIA REPAIR: POST OP INSTRUCTIONS  Always review your discharge instruction sheet given to you by the facility where your surgery was performed. IF YOU HAVE DISABILITY OR FAMILY LEAVE FORMS, YOU MUST BRING THEM TO THE OFFICE FOR PROCESSING.   DO NOT GIVE THEM TO YOUR DOCTOR.  1. A  prescription for pain medication may be given to you upon discharge.  Take your pain medication as prescribed, if needed.  If narcotic pain medicine is not needed, then you may take acetaminophen (Tylenol) or ibuprofen (Advil) as needed. 2. Take your usually prescribed medications unless otherwise directed. If you need a refill on your pain medication, please contact your pharmacy.  They will contact our office to request authorization. Prescriptions will not be filled after 5 pm or on week-ends. 3. You should follow a light diet the first 24 hours after arrival home, such as soup and crackers, etc.  Be sure to include lots of fluids daily.  Resume your normal diet the day after surgery. 4.Most patients will experience some swelling and bruising around the umbilicus .  Ice packs and reclining will help.  Swelling and bruising can take several days to resolve.  6. It is common to experience some constipation if taking pain medication after surgery.  Increasing fluid intake and taking a stool softener (such as Colace) will usually help or prevent this problem from occurring.  A mild laxative (Milk of Magnesia or Miralax) should be taken according to package directions if there are no bowel movements after 48 hours. 7. Unless discharge instructions indicate otherwise, you may remove your bandages 24-48 hours after surgery, and you may shower at that time.  You may have steri-strips (small skin tapes) in place directly over the incision.  .  If your surgeon used skin glue on the incision, you may shower in 24 hours.  The glue will flake off over the next 2-3 weeks.  Any  sutures or staples will be removed at the office during your follow-up visit. 8. ACTIVITIES:  You may resume regular (light) daily activities beginning the next day--such as daily self-care, walking, climbing stairs--gradually increasing activities as tolerated.  You may have sexual intercourse when it is comfortable.  Refrain from any heavy lifting or straining until approved by your doctor.  a.You may drive when you are no longer taking prescription pain medication, you can comfortably wear a seatbelt, and you can safely maneuver your car and apply brakes. b.RETURN TO WORK:   _____________________________________________  9.You should see your doctor in the office for a follow-up appointment approximately 2-3 weeks after your surgery.  Make sure that you call for this appointment within a day or two after you arrive home to insure a convenient appointment time. 10.OTHER INSTRUCTIONS: _________________________    _____________________________________  WHEN TO CALL YOUR DOCTOR: 1. Fever over 101.0 2. Inability to urinate 3. Nausea and/or vomiting 4. Extreme swelling or bruising 5. Continued bleeding from incision. 6. Increased pain, redness, or drainage from the incision  The clinic staff is available to answer your questions during regular business hours.  Please dont hesitate to call and ask to speak to one of the nurses for clinical concerns.  If you have a medical emergency, go to the nearest emergency room or call 911.  A [facility] from [facility] is always on call at the hospital   [address], [address], [place], [place]  [id number] ?  [id number], [place],  [place]   [id number] [phone] ? [phone] ? FAX [phone] Web site: [email or web address]     Post Anesthesia Home Care Instructions  Activity: Get plenty of rest for the remainder of the day. A responsible adult should stay with you for 24 hours following the procedure.  For the  next 24 hours, DO NOT: -Drive a car -Advertising copywriterperate machinery -Drink alcoholic beverages -Take any medication unless instructed by your physician -Make any legal decisions or sign important papers.  Meals: Start with liquid foods such as gelatin or soup. Progress to regular foods as tolerated. Avoid greasy, spicy, heavy foods. If nausea and/or vomiting occur, drink only clear liquids until the nausea and/or vomiting subsides. Call your physician if vomiting continues.  Special Instructions/Symptoms: Your throat may feel dry or sore from the anesthesia or the breathing tube placed in your throat during surgery. If this causes discomfort, gargle with warm salt water. The discomfort should disappear within 24 hours.  If you had a scopolamine patch placed behind your ear for the management of post- operative nausea and/or vomiting:  1. The medication in the patch is effective for 72 hours, after which it should be removed.  Wrap patch in a tissue and discard in the trash. Wash hands thoroughly with soap and water. 2. You may remove the patch earlier than 72 hours if you experience unpleasant side effects which may include dry mouth, dizziness or visual disturbances. 3. Avoid touching the patch. Wash your hands with soap and water after contact with the patch.

## 2016-04-14 NOTE — Transfer of Care (Signed)
Immediate Anesthesia Transfer of Care Note  Patient: Mallory Martin  Procedure(s) Performed: Procedure(s) with comments: OPEN REPAIR INCARCERATED UMBILICAL HERNIA WITH MESH (N/A) - OPEN REPAIR INCARCERATED UMBILICAL HERNIA WITH MESH INSERTION OF MESH (N/A) - INSERTION OF MESH  Patient Location: PACU  Anesthesia Type:General  Level of Consciousness: awake, alert  and patient cooperative  Airway & Oxygen Therapy: Patient Spontanous Breathing and Patient connected to face mask oxygen  Post-op Assessment: Report given to RN, Post -op Vital signs reviewed and stable and Patient moving all extremities  Post vital signs: Reviewed and stable  Last Vitals:  Vitals:   04/14/16 0632  BP: 112/65  Pulse: 62  Resp: 18  Temp: 36.5 C    Last Pain:  Vitals:   04/14/16 0632  TempSrc: Oral         Complications: No apparent anesthesia complications

## 2016-04-14 NOTE — Anesthesia Preprocedure Evaluation (Signed)
Anesthesia Evaluation  Patient identified by MRN, date of birth, ID band Patient awake    Reviewed: Allergy & Precautions, NPO status , Patient's Chart, lab work & pertinent test results  Airway Mallampati: I  TM Distance: >3 FB Neck ROM: Full    Dental   Pulmonary asthma ,    Pulmonary exam normal        Cardiovascular Normal cardiovascular exam     Neuro/Psych    GI/Hepatic   Endo/Other    Renal/GU      Musculoskeletal   Abdominal   Peds  Hematology   Anesthesia Other Findings   Reproductive/Obstetrics                             Anesthesia Physical Anesthesia Plan  ASA: II  Anesthesia Plan: General   Post-op Pain Management:    Induction: Intravenous  Airway Management Planned: LMA  Additional Equipment:   Intra-op Plan:   Post-operative Plan: Extubation in OR  Informed Consent: I have reviewed the patients History and Physical, chart, labs and discussed the procedure including the risks, benefits and alternatives for the proposed anesthesia with the patient or authorized representative who has indicated his/her understanding and acceptance.     Plan Discussed with: CRNA and Surgeon  Anesthesia Plan Comments:         Anesthesia Quick Evaluation

## 2016-04-14 NOTE — Anesthesia Postprocedure Evaluation (Signed)
Anesthesia Post Note  Patient: Lennox GrumblesFioanna S Hand  Procedure(s) Performed: Procedure(s) (LRB): OPEN REPAIR INCARCERATED UMBILICAL HERNIA WITH MESH (N/A) INSERTION OF MESH (N/A)  Patient location during evaluation: PACU Anesthesia Type: General Level of consciousness: awake and alert Pain management: pain level controlled Vital Signs Assessment: post-procedure vital signs reviewed and stable Respiratory status: spontaneous breathing, nonlabored ventilation, respiratory function stable and patient connected to nasal cannula oxygen Cardiovascular status: blood pressure returned to baseline and stable Postop Assessment: no signs of nausea or vomiting Anesthetic complications: no    Last Vitals:  Vitals:   04/14/16 0930 04/14/16 1000  BP: 111/63 109/69  Pulse: 64 (!) 55  Resp: 11 18  Temp:  36.7 C    Last Pain:  Vitals:   04/14/16 1000  TempSrc:   PainSc: 4                  Jahmiya Guidotti DAVID

## 2016-04-15 ENCOUNTER — Encounter (HOSPITAL_BASED_OUTPATIENT_CLINIC_OR_DEPARTMENT_OTHER): Payer: Self-pay | Admitting: General Surgery

## 2017-01-08 ENCOUNTER — Ambulatory Visit (HOSPITAL_COMMUNITY)
Admission: EM | Admit: 2017-01-08 | Discharge: 2017-01-08 | Disposition: A | Discharge status: Home / Self Care | Payer: BC Managed Care – PPO | Attending: Family Medicine | Admitting: Family Medicine

## 2017-01-08 ENCOUNTER — Encounter (HOSPITAL_COMMUNITY): Payer: Self-pay | Admitting: Emergency Medicine

## 2017-01-08 DIAGNOSIS — R1010 Upper abdominal pain, unspecified: Secondary | ICD-10-CM | POA: Diagnosis not present

## 2017-01-08 DIAGNOSIS — R3915 Urgency of urination: Secondary | ICD-10-CM | POA: Diagnosis not present

## 2017-01-08 DIAGNOSIS — N3001 Acute cystitis with hematuria: Secondary | ICD-10-CM | POA: Diagnosis not present

## 2017-01-08 DIAGNOSIS — Z3202 Encounter for pregnancy test, result negative: Secondary | ICD-10-CM | POA: Diagnosis not present

## 2017-01-08 LAB — POCT URINALYSIS DIP (DEVICE)
BILIRUBIN URINE: NEGATIVE
GLUCOSE, UA: NEGATIVE mg/dL
KETONES UR: NEGATIVE mg/dL
Nitrite: NEGATIVE
Specific Gravity, Urine: 1.025 (ref 1.005–1.030)
Urobilinogen, UA: 0.2 mg/dL (ref 0.0–1.0)
pH: 7 (ref 5.0–8.0)

## 2017-01-08 LAB — POCT PREGNANCY, URINE: PREG TEST UR: NEGATIVE

## 2017-01-08 MED ORDER — PHENAZOPYRIDINE HCL 200 MG PO TABS: 200.0000 mg | ORAL_TABLET | Freq: Three times a day (TID) | ORAL | 0 refills | Status: DC

## 2017-01-08 MED ORDER — SULFAMETHOXAZOLE-TRIMETHOPRIM 800-160 MG PO TABS: 1.0000 | ORAL_TABLET | Freq: Two times a day (BID) | ORAL | 0 refills | Status: AC

## 2017-01-08 NOTE — Discharge Instructions (Signed)
Pregnancy test is negative We have called in 2 different prescriptions for you, one as an antibiotic to get rid of the infection and the other has a medicine to stop the urgency symptoms and lower pelvic pain.

## 2017-01-08 NOTE — ED Provider Notes (Signed)
[facility]    CSN: [id number] Arrival date & time: 01/08/17  1935     History   Chief Complaint Chief Complaint  Patient presents with  . Urinary Tract Infection    HPI Mallory Martin is a 41 y.o. female.   Patient is a 41 year old woman who comes to the [facility] urgent care because she's thinking she may  have a urinary tract infection.  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 She started having some lower abdominal discomfort last night. She managed to get through the day without too many symptoms but by this evening when she was in a nail salon, she had urgency, and lower abdominal pressure. She did notice some hematuria earlier this morning.      Past Medical History:  Diagnosis Date  . Asthma   . Postpartum care following cesarean delivery (10/24) 03/17/2014    Patient Active Problem List   Diagnosis Date Noted  . Lactation disorder, postpartum condition 03/20/2014  . Acute blood loss anemia 03/20/2014  . Postpartum care following cesarean delivery (10/24) 03/17/2014  . Delivery by cesarean section at 37-39 weeks of gestation due to labor 03/17/2014  . Umbilical hernia with obstruction but no gangrene 12/18/2013  . Abnormal findings on antenatal screening 10/11/2013  . Elderly multigravida with antepartum condition or complication 10/11/2013    Past Surgical History:  Procedure Laterality Date  . CESAREAN SECTION  2002  . CESAREAN SECTION N/A 03/17/2014   Procedure: CESAREAN SECTION;  Surgeon: Tresa Endo A. Ernestina Penna, MD;  Location: [facility];  Service: Obstetrics;  Laterality: N/A;  repeat c-section  . INSERTION OF MESH N/A 04/14/2016   Procedure: INSERTION OF MESH;  Surgeon: Claud Kelp, MD;  Location: [facility];  Service: General;  Laterality: N/A;  INSERTION OF MESH  . UMBILICAL HERNIA REPAIR N/A 04/14/2016   Procedure: OPEN REPAIR INCARCERATED UMBILICAL HERNIA WITH MESH;   Surgeon: Claud Kelp, MD;  Location: [facility];  Service: General;  Laterality: N/A;  OPEN REPAIR INCARCERATED UMBILICAL HERNIA WITH MESH  . WISDOM TOOTH EXTRACTION      OB History    Gravida Para Term Preterm AB Living   2 2 2  0 0 2   SAB TAB Ectopic Multiple Live Births   0 0 0 0 1       Home Medications    Prior to Admission medications   Medication Sig Start Date End Date Taking? Authorizing Provider  Multiple Vitamins-Minerals (MULTIVITAMIN WITH MINERALS) tablet Take 1 tablet by mouth daily.    [provider]  phenazopyridine (PYRIDIUM) 200 MG tablet Take 1 tablet (200 mg total) by mouth 3 (three) times daily. 01/08/17   Elvina Sidle, MD  sulfamethoxazole-trimethoprim (BACTRIM DS,SEPTRA DS) 800-160 MG tablet Take 1 tablet by mouth 2 (two) times daily. 01/08/17 01/15/17  Elvina Sidle, MD    Family History No family history on file.  Social History Social History  Substance Use Topics  . Smoking status: Never Smoker  . Smokeless tobacco: Never Used  . Alcohol use No     Allergies   Advil [ibuprofen]; Excedrin extra strength [aspirin-acetaminophen-caffeine]; Oxycodone; and Vicodin [hydrocodone-acetaminophen]   Review of Systems Review of Systems  Constitutional: Negative for fever.  Genitourinary: Positive for dysuria, frequency, hematuria and pelvic pain.  Musculoskeletal: Negative for back pain.  All other systems reviewed and are negative.    Physical Exam Triage Vital Signs ED Triage Vitals  Enc Vitals Group  BP 01/08/17 2004 (!) 143/81     Pulse Rate 01/08/17 2004 65     Resp 01/08/17 2004 16     Temp 01/08/17 2004 99 F (37.2 C)     Temp Source 01/08/17 2004 Oral     SpO2 01/08/17 2004 100 %     Weight 01/08/17 2005 177 lb (80.3 kg)     Height 01/08/17 2005 5\' 2"  (1.575 m)     Head Circumference --      Peak Flow --      Pain Score 01/08/17 2005 6     Pain Loc --      Pain Edu? --      Excl. in GC? --     No data found.   Updated Vital Signs BP (!) 143/81   Pulse 65   Temp 99 F (37.2 C) (Oral)   Resp 16   Ht 5\' 2"  (1.575 m)   Wt 177 lb (80.3 kg)   LMP 12/26/2016   SpO2 100%   BMI 32.37 kg/m    Physical Exam  Constitutional: She is oriented to person, place, and time. She appears well-developed and well-nourished.  HENT:  Right Ear: External ear normal.  Left Ear: External ear normal.  Mouth/Throat: Oropharynx is clear and moist.  Eyes: Pupils are equal, round, and reactive to light. Conjunctivae are normal.  Neck: Normal range of motion. Neck supple.  Pulmonary/Chest: Effort normal.  Abdominal: Soft.  Mild suprapubic tenderness  Musculoskeletal: Normal range of motion.  Neurological: She is alert and oriented to person, place, and time.  Skin: Skin is warm and dry.  Nursing note and vitals reviewed.    UC Treatments / Results  Labs (all labs ordered are listed, but only abnormal results are displayed) Labs Reviewed  POCT URINALYSIS DIP (DEVICE) - Abnormal; Notable for the following:       Result Value   Hgb urine dipstick LARGE (*)    Protein, ur >=300 (*)    Leukocytes, UA LARGE (*)    All other components within normal limits  POCT PREGNANCY, URINE    EKG  EKG Interpretation None       Radiology No results found.  Procedures Procedures (including critical care time)  Medications Ordered in UC Medications - No data to display   Initial Impression / Assessment and Plan / UC Course  I have reviewed the triage vital signs and the nursing notes.  Pertinent labs & imaging results that were available during my care of the patient were reviewed by me and considered in my medical decision making (see chart for details).     Final Clinical Impressions(s) / UC Diagnoses   Final diagnoses:  Acute cystitis with hematuria    New Prescriptions New Prescriptions   PHENAZOPYRIDINE (PYRIDIUM) 200 MG TABLET    Take 1 tablet (200 mg total) by mouth 3  (three) times daily.   SULFAMETHOXAZOLE-TRIMETHOPRIM (BACTRIM DS,SEPTRA DS) 800-160 MG TABLET    Take 1 tablet by mouth 2 (two) times daily.     Controlled Substance Prescriptions [place] Controlled Substance Registry consulted? Not Applicable   Elvina Sidle, MD 01/08/17 2026

## 2017-03-17 DIAGNOSIS — M79641 Pain in right hand: Secondary | ICD-10-CM | POA: Diagnosis not present

## 2017-07-07 DIAGNOSIS — J4521 Mild intermittent asthma with (acute) exacerbation: Secondary | ICD-10-CM | POA: Diagnosis not present

## 2017-07-25 DIAGNOSIS — J029 Acute pharyngitis, unspecified: Secondary | ICD-10-CM | POA: Diagnosis not present

## 2017-07-25 DIAGNOSIS — N926 Irregular menstruation, unspecified: Secondary | ICD-10-CM | POA: Diagnosis not present

## 2017-09-30 ENCOUNTER — Other Ambulatory Visit: Payer: Self-pay

## 2017-09-30 ENCOUNTER — Ambulatory Visit (INDEPENDENT_AMBULATORY_CARE_PROVIDER_SITE_OTHER): Payer: BC Managed Care – PPO | Admitting: Family Medicine

## 2017-09-30 ENCOUNTER — Encounter: Payer: Self-pay | Admitting: Family Medicine

## 2017-09-30 VITALS — BP 102/68 | HR 74 | Temp 98.7°F | Resp 16 | Ht 62.0 in | Wt 181.2 lb

## 2017-09-30 DIAGNOSIS — N926 Irregular menstruation, unspecified: Secondary | ICD-10-CM | POA: Diagnosis not present

## 2017-09-30 DIAGNOSIS — N946 Dysmenorrhea, unspecified: Secondary | ICD-10-CM

## 2017-09-30 DIAGNOSIS — Z3169 Encounter for other general counseling and advice on procreation: Secondary | ICD-10-CM | POA: Diagnosis not present

## 2017-09-30 DIAGNOSIS — K5900 Constipation, unspecified: Secondary | ICD-10-CM | POA: Diagnosis not present

## 2017-09-30 NOTE — Progress Notes (Signed)
Chief Complaint  Patient presents with  . New Patient (Initial Visit)    est. care.  Sunday intermittent belly pain , took 2 cleanses and stools were dark, hx of digestive issues.  Pt has been having odd menstrual cycles, december '18- Jan 19 menses  2 weeks late but normal, feb 19 not normal lasted only 3 days, march '19 normal, april late but  normal and May back to real normal.  Over the past 2 weeks per pt she has been exercising and has changed her diet    HPI This is a new patient here to establish care.  Period concerns and infertility Patient reports that she has been having periods that are every 22 days and last 7 days She states that she started noticing that her periods have been having changes to her menses were coming one or two week early or late She states that she has some heavy periods this month She states that she is using the super tampon every 2 hours She is passing some clots today  She denies any lightheadedness She states that she has been having heavy cramps as well She reports that she has to go lay down  She states that she is not on any hormonal birth control  She has a 42yo and a 42 yo and desires pregnancy She is established at [facility]  Chronic Constipation She also reports a history of chronic constipation She takes an over the counter digestive health laxative She has about 1 bm a week  She has changed her diet to more plants and exercises 3 times a week She still has difficulty with BM and they are usually small and hard  She drinks plenty of water   Past Medical History:  Diagnosis Date  . Allergy   . Anemia   . Asthma   . Constipation   . Migraines    menstrual migraines  . Postpartum care following cesarean delivery (10/24) 03/17/2014    Current Outpatient Medications  Medication Sig Dispense Refill  . Misc Natural Products (COLON HERBAL CLEANSER PO) Take by mouth. One time a day for 7 days prn    . Multiple  Vitamins-Minerals (MULTIVITAMIN WITH MINERALS) tablet Take 1 tablet by mouth daily.     No current facility-administered medications for this visit.     Allergies:  Allergies  Allergen Reactions  . Advil [Ibuprofen] Swelling  . Excedrin Extra Strength [Aspirin-Acetaminophen-Caffeine] Swelling    Excedrin Migraine  . Oxycodone Nausea Only  . Vicodin [Hydrocodone-Acetaminophen] Nausea Only    Past Surgical History:  Procedure Laterality Date  . CESAREAN SECTION  2002  . CESAREAN SECTION N/A 03/17/2014   Procedure: CESAREAN SECTION;  Surgeon: Tresa Endo A. Ernestina Penna, MD;  Location: [facility];  Service: Obstetrics;  Laterality: N/A;  repeat c-section  . HERNIA REPAIR    . INSERTION OF MESH N/A 04/14/2016   Procedure: INSERTION OF MESH;  Surgeon: Claud Kelp, MD;  Location: [facility];  Service: General;  Laterality: N/A;  INSERTION OF MESH  . UMBILICAL HERNIA REPAIR N/A 04/14/2016   Procedure: OPEN REPAIR INCARCERATED UMBILICAL HERNIA WITH MESH;  Surgeon: Claud Kelp, MD;  Location: [facility];  Service: General;  Laterality: N/A;  OPEN REPAIR INCARCERATED UMBILICAL HERNIA WITH MESH  . WISDOM TOOTH EXTRACTION      Social History   Socioeconomic History  . Marital status: Married    Spouse name: Not on file  . Number of children: Not on file  .  Years of education: Not on file  . Highest education level: Not on file  Occupational History  . Not on file  Social Needs  . Financial resource strain: Not on file  . Food insecurity:    Worry: Not on file    Inability: Not on file  . Transportation needs:    Medical: Not on file    Non-medical: Not on file  Tobacco Use  . Smoking status: Never Smoker  . Smokeless tobacco: Never Used  Substance and Sexual Activity  . Alcohol use: No  . Drug use: No  . Sexual activity: Yes    Birth control/protection: Condom  Lifestyle  . Physical activity:    Days per week: Not on file    Minutes per session: Not  on file  . Stress: Not on file  Relationships  . Social connections:    Talks on phone: Not on file    Gets together: Not on file    Attends religious service: Not on file    Active member of club or organization: Not on file    Attends meetings of clubs or organizations: Not on file    Relationship status: Not on file  Other Topics Concern  . Not on file  Social History Narrative  . Not on file    Family History  Problem Relation Age of Onset  . Cancer Mother   . Heart disease Mother   . Mental illness Sister      ROS Review of Systems See HPI Constitution: No fevers or chills No malaise No diaphoresis Skin: No rash or itching Eyes: no blurry vision, no double vision GU: no dysuria or hematuria GI: +constipation, no diarrhea Neuro: no dizziness or headaches * all others reviewed and negative   Objective: Vitals:   09/30/17 1637  BP: 102/68  Pulse: 74  Resp: 16  Temp: 98.7 F (37.1 C)  TempSrc: Oral  SpO2: 100%  Weight: 181 lb 3.2 oz (82.2 kg)  Height:  (1.575 m)    Physical Exam  Constitutional: She is oriented to person, place, and time. She appears well-developed and well-nourished.  HENT:  Head: Normocephalic and atraumatic.  Eyes: Conjunctivae and EOM are normal.  Cardiovascular: Normal rate, regular rhythm and normal heart sounds.  No murmur heard. Pulmonary/Chest: Effort normal and breath sounds normal. No stridor. No respiratory distress. She has no wheezes.  Abdominal: Soft. Bowel sounds are normal. She exhibits no distension and no mass. There is no tenderness. There is no rebound and no guarding. No hernia.  Neurological: She is alert and oriented to person, place, and time.  Skin: Skin is warm. Capillary refill takes less than 2 seconds.  Psychiatric: She has a normal mood and affect. Her behavior is normal. Judgment and thought content normal.    Assessment and Plan Mallory Martin was seen today for new patient (initial visit).  Diagnoses  and all orders for this visit: Constipation, unspecified constipation type -   Advised pt to try metamucil at least once daily before meals -   Discussed that she should stop laxatives  Menstrual problem Menstrual cramps Pre-conception counseling  -  Advised to follow up with [facility] for discussion about fertility concern and irregular periods -  Discussed that "normal" period cycles occur every 21-35 days lasting 3-7 days    Zoe A Stallings

## 2017-09-30 NOTE — Patient Instructions (Addendum)
IF you received an x-ray today, you will receive an invoice from [facility]. Please contact [facility] at [phone] with questions or concerns regarding your invoice.   IF you received labwork today, you will receive an invoice from [facility]. Please contact [facility] at [phone] with questions or concerns regarding your invoice.   Our billing staff will not be able to assist you with questions regarding bills from these companies.  You will be contacted with the lab results as soon as they are available. The fastest way to get your results is to activate your My Chart account. Instructions are located on the last page of this paperwork. If you have not heard from [place] regarding the results in 2 weeks, please contact this office.    We recommend that you schedule a mammogram for breast cancer screening. Typically, you do not need a referral to do this. Please contact a local imaging center to schedule your mammogram.  [facility] - [phone]  *ask for the Radiology Department [facility]) - [phone] or [phone]  [facility] - [phone] [facility] - [phone] [facility] - [phone]  *ask for the Radiology Department [facility] - [phone]  *ask for the Radiology Department [facility] - [phone]  *ask for the Mammography Department [facility] - [phone] Constipation, Adult Constipation is when a person has fewer bowel movements in a week than normal, has difficulty having a bowel movement, or has stools that are dry, hard, or larger than normal. Constipation may be caused by an underlying condition. It may become worse with age if a person takes certain medicines and does not take in enough fluids. Follow these instructions at home: Eating and drinking   Eat foods that have a lot of fiber, such as fresh fruits and  vegetables, whole grains, and beans.  Limit foods that are high in fat, low in fiber, or overly processed, such as french fries, hamburgers, cookies, candies, and soda.  Drink enough fluid to keep your urine clear or pale yellow. General instructions  Exercise regularly or as told by your health care provider.  Go to the restroom when you have the urge to go. Do not hold it in.  Take over-the-counter and prescription medicines only as told by your health care provider. These include any fiber supplements.  Practice pelvic floor retraining exercises, such as deep breathing while relaxing the lower abdomen and pelvic floor relaxation during bowel movements.  Watch your condition for any changes.  Keep all follow-up visits as told by your health care provider. This is important. Contact a health care provider if:  You have pain that gets worse.  You have a fever.  You do not have a bowel movement after 4 days.  You vomit.  You are not hungry.  You lose weight.  You are bleeding from the anus.  You have thin, pencil-like stools. Get help right away if:  You have a fever and your symptoms suddenly get worse.  You leak stool or have blood in your stool.  Your abdomen is bloated.  You have severe pain in your abdomen.  You feel dizzy or you faint. This information is not intended to replace advice given to you by your health care provider. Make sure you discuss any questions you have with your health care provider. Document Released: 02/07/2004 Document Revised: 11/29/2015 Document Reviewed: 10/30/2015 Elsevier Interactive Patient  Education  2018 Elsevier Inc.  

## 2017-10-04 ENCOUNTER — Other Ambulatory Visit: Payer: Self-pay | Admitting: Family Medicine

## 2017-10-04 DIAGNOSIS — Z1231 Encounter for screening mammogram for malignant neoplasm of breast: Secondary | ICD-10-CM

## 2017-10-05 DIAGNOSIS — R490 Dysphonia: Secondary | ICD-10-CM | POA: Diagnosis not present

## 2017-10-22 ENCOUNTER — Ambulatory Visit
Admission: RE | Admit: 2017-10-22 | Discharge: 2017-10-22 | Disposition: A | Discharge status: Home / Self Care | Payer: BC Managed Care – PPO | Source: Ambulatory Visit | Attending: Family Medicine | Admitting: Family Medicine

## 2017-10-22 DIAGNOSIS — Z1231 Encounter for screening mammogram for malignant neoplasm of breast: Secondary | ICD-10-CM

## 2017-10-25 ENCOUNTER — Other Ambulatory Visit: Payer: Self-pay | Admitting: Family Medicine

## 2017-10-25 DIAGNOSIS — R928 Other abnormal and inconclusive findings on diagnostic imaging of breast: Secondary | ICD-10-CM

## 2017-10-27 ENCOUNTER — Ambulatory Visit
Admission: RE | Admit: 2017-10-27 | Discharge: 2017-10-27 | Disposition: A | Discharge status: Home / Self Care | Payer: BC Managed Care – PPO | Source: Ambulatory Visit | Attending: Family Medicine | Admitting: Family Medicine

## 2017-10-27 ENCOUNTER — Other Ambulatory Visit: Payer: Self-pay | Admitting: Family Medicine

## 2017-10-27 DIAGNOSIS — R928 Other abnormal and inconclusive findings on diagnostic imaging of breast: Secondary | ICD-10-CM

## 2017-10-27 DIAGNOSIS — N6310 Unspecified lump in the right breast, unspecified quadrant: Secondary | ICD-10-CM

## 2018-04-29 ENCOUNTER — Other Ambulatory Visit: Payer: BC Managed Care – PPO

## 2018-05-12 ENCOUNTER — Ambulatory Visit
Admission: RE | Admit: 2018-05-12 | Discharge: 2018-05-12 | Disposition: A | Discharge status: Home / Self Care | Payer: BC Managed Care – PPO | Source: Ambulatory Visit | Attending: Family Medicine | Admitting: Family Medicine

## 2018-05-12 ENCOUNTER — Other Ambulatory Visit: Payer: Self-pay | Admitting: Family Medicine

## 2018-05-12 DIAGNOSIS — N6310 Unspecified lump in the right breast, unspecified quadrant: Secondary | ICD-10-CM

## 2018-11-14 ENCOUNTER — Ambulatory Visit
Admission: RE | Admit: 2018-11-14 | Discharge: 2018-11-14 | Disposition: A | Discharge status: Home / Self Care | Payer: BC Managed Care – PPO | Source: Ambulatory Visit | Attending: Family Medicine | Admitting: Family Medicine

## 2018-11-14 ENCOUNTER — Other Ambulatory Visit: Payer: Self-pay

## 2018-11-14 DIAGNOSIS — N6310 Unspecified lump in the right breast, unspecified quadrant: Secondary | ICD-10-CM

## 2019-04-18 ENCOUNTER — Other Ambulatory Visit: Payer: Self-pay

## 2019-04-18 ENCOUNTER — Encounter: Payer: Self-pay | Admitting: Family Medicine

## 2019-04-18 ENCOUNTER — Ambulatory Visit (INDEPENDENT_AMBULATORY_CARE_PROVIDER_SITE_OTHER): Payer: BC Managed Care – PPO | Admitting: Family Medicine

## 2019-04-18 VITALS — BP 115/68 | HR 58 | Temp 97.9°F | Ht 62.0 in | Wt 184.6 lb

## 2019-04-18 DIAGNOSIS — M546 Pain in thoracic spine: Secondary | ICD-10-CM

## 2019-04-18 MED ORDER — ASPERCREME LIDOCAINE 4 % EX LIQD: 1.0000 "application " | Freq: Two times a day (BID) | CUTANEOUS | 3 refills | Status: DC

## 2019-04-18 NOTE — Patient Instructions (Addendum)
If you have lab work done today you will be contacted with your lab results within the next 2 weeks.  If you have not heard from [place] then please contact us. The fastest way to get your results is to register for My Chart.   IF you received an x-ray today, you will receive an invoice from [facility]. Please contact [facility] at [phone] with questions or concerns regarding your invoice.   IF you received labwork today, you will receive an invoice from [facility]. Please contact [facility] at [phone] with questions or concerns regarding your invoice.   Our billing staff will not be able to assist you with questions regarding bills from these companies.  You will be contacted with the lab results as soon as they are available. The fastest way to get your results is to activate your My Chart account. Instructions are located on the last page of this paperwork. If you have not heard from [place] regarding the results in 2 weeks, please contact this office.      Thoracic Strain A thoracic strain, which is sometimes called a mid-back strain, is an injury to the muscles or tendons that attach to the upper part of your back behind your chest. This type of injury occurs when a muscle is overstretched or overloaded. Thoracic strains can range from mild to severe. Mild strains may involve stretching a muscle or tendon without tearing it. These injuries may heal in 1-2 weeks. More severe strains involve tearing of muscle fibers or tendons. These will cause more pain and may take 6-8 weeks to heal. What are the causes? This condition may be caused by:  Trauma, such as a fall or a hit to the body.  Twisting or overstretching the back. This may result from doing activities that require a lot of energy, such as lifting heavy objects. In some cases, the cause may not be known. What increases the risk? This injury is more common in:  Athletes.  People with obesity. What are the  signs or symptoms? The main symptom of this condition is pain in the middle back, especially with movement. Other symptoms include:  Stiffness or limited range of motion.  Sudden muscle tightening (spasms). How is this diagnosed? This condition may be diagnosed based on:  Your symptoms.  Your medical history.  A physical exam.  Imaging tests, such as X-rays or an MRI. How is this treated? This condition may be treated with:  Resting the injured area.  Applying heat and cold to the injured area.  Over-the-counter medicines for pain and inflammation, such as NSAIDs.  Prescription pain medicine or muscle relaxants may be needed for a short time.  Physical therapy. This will involve doing stretching and strengthening exercises. Follow these instructions at home: Managing pain, stiffness, and swelling      If directed, put ice on the injured area. ? Put ice in a plastic bag. ? Place a towel between your skin and the bag. ? Leave the ice on for 20 minutes, 2-3 times a day.  If directed, apply heat to the affected area as often as told by your health care provider. Use the heat source that your health care provider recommends, such as a moist heat pack or a heating pad. ? Place a towel between your skin and the heat source. ? Leave the heat on for 20-30 minutes. ? Remove the heat if your skin turns bright red. This is especially important if you are unable  to feel pain, heat, or cold. You may have a greater risk of getting burned. Activity  Rest and return to your normal activities as told by your health care provider. Ask your health care provider what activities are safe for you.  Do exercises as told by your health care provider. Medicines  Take over-the-counter and prescription medicines only as told by your health care provider.  Ask your health care provider if the medicine prescribed to you: ? Requires you to avoid driving or using heavy machinery. ? Can cause  constipation. You may need to take these actions to prevent or treat constipation:  Drink enough fluid to keep your urine pale yellow.  Take over-the-counter or prescription medicines.  Eat foods that are high in fiber, such as beans, whole grains, and fresh fruits and vegetables.  Limit foods that are high in fat and processed sugars, such as fried or sweet foods. Injury prevention To prevent a future mid-back injury:  Always warm up properly before physical activity or sports.  Cool down and stretch after being active.  Use correct form when playing sports and lifting heavy objects. Bend your knees before you lift heavy objects.  Use good posture when sitting and standing.  Stay physically fit and maintain a healthy weight. ? Do at least 150 minutes of moderate-intensity exercise each week, such as brisk walking or water aerobics. ? Do strength exercises at least 2 times each week.  General instructions  Do not use any products that contain nicotine or tobacco, such as cigarettes, e-cigarettes, and chewing tobacco. If you need help quitting, ask your health care provider.  Keep all follow-up visits as told by your health care provider. This is important. Contact a health care provider if:  Your pain is not helped by medicine.  Your pain or stiffness is getting worse.  You develop pain or stiffness in your neck or lower back. Get help right away if you:  Have shortness of breath.  Have chest pain.  Develop numbness or weakness in your legs or arms.  Have involuntary loss of urine (urinary incontinence). Summary  A thoracic strain, which is sometimes called a mid-back strain, is an injury to the muscles or tendons that attach to the upper part of your back behind your chest.  This type of injury occurs when a muscle is overstretched or overloaded.  Rest and return to your normal activities as told by your health care provider. If directed, apply heat or ice to the  affected area as often as told by your health care provider.  Take over-the-counter and prescription medicines only as told by your health care provider.  Contact a health care provider if you have new or worsening symptoms. This information is not intended to replace advice given to you by your health care provider. Make sure you discuss any questions you have with your health care provider. Document Released: 08/01/2003 Document Revised: 03/29/2018 Document Reviewed: 03/29/2018 Elsevier Patient Education  2020 [employer].

## 2019-04-18 NOTE — Progress Notes (Signed)
Established Patient Office Visit  Subjective:  Patient ID: Mallory Martin, female    DOB: [birth year]  Age: 43 y.o. MRN: [id number]  CC:  Chief Complaint  Patient presents with  . Back Pain    sleeps w/ heating pad every night. (lower back) going on 1 month     HPI Mallory Martin presents for   Patient reports that she is having low back tightness It feels like it is across her back  There was no associated injury The pain is worse towards the end of the day She uses a heating pad to relax her muscles The pain is 6/10. 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 She takes a tylenol PM and is drinking sleepy time tea.  She reports that she has not taking any other medications.   Past Medical History:  Diagnosis Date  . Allergy   . Anemia   . Asthma   . Constipation   . Migraines    menstrual migraines  . Postpartum care following cesarean delivery (10/24) 03/17/2014    Past Surgical History:  Procedure Laterality Date  . CESAREAN SECTION  2002  . CESAREAN SECTION N/A 03/17/2014   Procedure: CESAREAN SECTION;  Surgeon: Claiborne Billings A. Pamala Hurry, MD;  Location: [facility] [facility];  Service: Obstetrics;  Laterality: N/A;  repeat c-section  . HERNIA REPAIR    . INSERTION OF MESH N/A 04/14/2016   Procedure: INSERTION OF MESH;  Surgeon: Fanny Skates, MD;  Location: [place];  Service: General;  Laterality: N/A;  INSERTION OF MESH  . UMBILICAL HERNIA REPAIR N/A 04/14/2016   Procedure: OPEN REPAIR INCARCERATED UMBILICAL HERNIA WITH MESH;  Surgeon: Fanny Skates, MD;  Location: [place];  Service: General;  Laterality: N/A;  OPEN REPAIR INCARCERATED UMBILICAL HERNIA WITH MESH  . WISDOM TOOTH EXTRACTION      Family History  Problem Relation Age of Onset  . Cancer Mother   . Heart disease Mother   . Mental illness Sister     Social History   Socioeconomic History  . Marital status: Married    Spouse name: Not on file  . Number of  children: Not on file  . Years of education: Not on file  . Highest education level: Not on file  Occupational History  . Not on file  Social Needs  . Financial resource strain: Not on file  . Food insecurity    Worry: Not on file    Inability: Not on file  . Transportation needs    Medical: Not on file    Non-medical: Not on file  Tobacco Use  . Smoking status: Never Smoker  . Smokeless tobacco: Never Used  Substance and Sexual Activity  . Alcohol use: No  . Drug use: No  . Sexual activity: Yes    Birth control/protection: Condom  Lifestyle  . Physical activity    Days per week: Not on file    Minutes per session: Not on file  . Stress: Not on file  Relationships  . Social Herbalist on phone: Not on file    Gets together: Not on file    Attends religious service: Not on file    Active member of club or organization: Not on file    Attends meetings of clubs or organizations: Not on file    Relationship status: Not on file  . Intimate partner violence    Fear of current or ex partner:  Not on file    Emotionally abused: Not on file    Physically abused: Not on file    Forced sexual activity: Not on file  Other Topics Concern  . Not on file  Social History Narrative  . Not on file    Outpatient Medications Prior to Visit  Medication Sig Dispense Refill  . acetaminophen (TYLENOL) 500 MG tablet Take by mouth.    . levothyroxine (SYNTHROID) 50 MCG tablet Take 50 mcg by mouth daily.    . Multiple Vitamins-Minerals (MULTIVITAMIN WITH MINERALS) tablet Take 1 tablet by mouth daily.    . Misc Natural Products (COLON HERBAL CLEANSER PO) Take by mouth. One time a day for 7 days prn     No facility-administered medications prior to visit.     Allergies  Allergen Reactions  . Advil [Ibuprofen] Swelling  . Excedrin Extra Strength [Aspirin-Acetaminophen-Caffeine] Swelling    Excedrin Migraine  . Excedrin Migraine  [Asa-Apap-Caff Buffered] Swelling  . Oxycodone  Nausea Only  . Vicodin [Hydrocodone-Acetaminophen] Nausea Only    ROS Review of Systems Review of Systems  Constitutional: Negative for activity change, appetite change, chills and fever.  HENT: Negative for congestion, nosebleeds, trouble swallowing and voice change.   Respiratory: Negative for cough, shortness of breath and wheezing.   Gastrointestinal: Negative for diarrhea, nausea and vomiting.  Genitourinary: Negative for difficulty urinating, dysuria, flank pain and hematuria.  Musculoskeletal: Negative for back pain, joint swelling and neck pain.  Neurological: Negative for dizziness, speech difficulty, light-headedness and numbness.  See HPI. All other review of systems negative.     Objective:    Physical Exam  BP 115/68   Pulse (!) 58   Temp 97.9 F (36.6 C) (Oral)   Ht '5\' 2"'$  (1.575 m)   Wt 184 lb 9.6 oz (83.7 kg)   LMP 04/12/2019   SpO2 100%   BMI 33.76 kg/m  Wt Readings from Last 3 Encounters:  04/18/19 184 lb 9.6 oz (83.7 kg)  09/30/17 181 lb 3.2 oz (82.2 kg)  01/08/17 177 lb (80.3 kg)   Physical Exam  Constitutional: Oriented to person, place, and time. Appears well-developed and well-nourished.  HENT:  Head: Normocephalic and atraumatic.  Eyes: Conjunctivae and EOM are normal.  Cardiovascular: Normal rate, regular rhythm, normal heart sounds and intact distal pulses.  No murmur heard. Pulmonary/Chest: Effort normal and breath sounds normal. No stridor. No respiratory distress. Has no wheezes.  Neurological: Is alert and oriented to person, place, and time.  Skin: Skin is warm. Capillary refill takes less than 2 seconds.  Psychiatric: Has a normal mood and affect. Behavior is normal. Judgment and thought content normal.  Thoracic back - muscle spasms bilaterally Lumbar Radiculopathy Exam Back exam: full range of motion, no tenderness, palpable spasm or pain on motion. Straight-leg raise: Negative bilaterally Reflexes:       Right leg: 2+ at knees  bilaterally      Left leg: 2+ at knees bilaterally Strength: normal and equal bilaterally  Sensory exam: normal in both lower extremities.  Able to toe walk, heel walk without difficulty or obvious weakness. No obvious pain with hip motion or log rolling of leg.   Health Maintenance Due  Topic Date Due  . PAP SMEAR-Modifier  06/01/1997  . TETANUS/TDAP  12/23/2016  . INFLUENZA VACCINE  12/24/2018    There are no preventive care reminders to display for this patient.  No results found for: TSH Lab Results  Component Value Date   WBC 13.5 (  H) 03/18/2014   HGB 10.7 (L) 03/18/2014   HCT 31.5 (L) 03/18/2014   MCV 84.9 03/18/2014   PLT 223 03/18/2014   No results found for: NA, K, CHLORIDE, CO2, GLUCOSE, BUN, CREATININE, BILITOT, ALKPHOS, AST, ALT, PROT, ALBUMIN, CALCIUM, ANIONGAP, EGFR, GFR No results found for: CHOL No results found for: HDL No results found for: LDLCALC No results found for: TRIG No results found for: CHOLHDL No results found for: HGBA1C    Assessment & Plan:   Problem List Items Addressed This Visit    None    Visit Diagnoses    Acute bilateral thoracic back pain    -  Primary   Relevant Medications   acetaminophen (TYLENOL) 500 MG tablet   Other Relevant Orders   Ambulatory referral to Physical Therapy    advised physical therapist Topical treatment  Home care reviewed  Meds ordered this encounter  Medications  . Lidocaine HCl (ASPERCREME LIDOCAINE) 4 % LIQD    Sig: Apply 1 application topically 2 (two) times daily.    Dispense:  60 mL    Refill:  3    Follow-up: No follow-ups on file.    Forrest Moron, MD

## 2019-05-03 ENCOUNTER — Ambulatory Visit: Payer: BC Managed Care – PPO | Admitting: Physical Therapy

## 2019-05-17 ENCOUNTER — Ambulatory Visit: Payer: BC Managed Care – PPO | Attending: Family Medicine | Admitting: Physical Therapy

## 2019-05-17 ENCOUNTER — Other Ambulatory Visit: Payer: Self-pay

## 2019-05-17 ENCOUNTER — Encounter: Payer: Self-pay | Admitting: Physical Therapy

## 2019-05-17 DIAGNOSIS — M62830 Muscle spasm of back: Secondary | ICD-10-CM | POA: Diagnosis present

## 2019-05-17 DIAGNOSIS — M545 Low back pain, unspecified: Secondary | ICD-10-CM

## 2019-05-17 NOTE — Therapy (Signed)
[place], [place], [address] Phone: [phone]   Fax:  [phone]  Physical Therapy Evaluation  Patient Details  Name: Mallory Martin MRN: [id number] Date of Birth: 12-13-1975 Referring Provider (PT): Nolon Rod   Encounter Date: 05/17/2019  PT End of Session - 05/17/19 0910    Visit Number  1    Date for PT Re-Evaluation  07/18/19    PT Start Time  0833    PT Stop Time  0924    PT Time Calculation (min)  51 min    Activity Tolerance  Patient tolerated treatment well    Behavior During Therapy  [facility] for tasks assessed/performed       Past Medical History:  Diagnosis Date  . Allergy   . Anemia   . Asthma   . Constipation   . Migraines    menstrual migraines  . Postpartum care following cesarean delivery (10/24) 03/17/2014    Past Surgical History:  Procedure Laterality Date  . CESAREAN SECTION  2002  . CESAREAN SECTION N/A 03/17/2014   Procedure: CESAREAN SECTION;  Surgeon: Claiborne Billings A. Pamala Hurry, MD;  Location: [facility];  Service: Obstetrics;  Laterality: N/A;  repeat c-section  . HERNIA REPAIR    . INSERTION OF MESH N/A 04/14/2016   Procedure: INSERTION OF MESH;  Surgeon: Fanny Skates, MD;  Location: [place];  Service: General;  Laterality: N/A;  INSERTION OF MESH  . UMBILICAL HERNIA REPAIR N/A 04/14/2016   Procedure: OPEN REPAIR INCARCERATED UMBILICAL HERNIA WITH MESH;  Surgeon: Fanny Skates, MD;  Location: [place];  Service: General;  Laterality: N/A;  OPEN REPAIR INCARCERATED UMBILICAL HERNIA WITH MESH  . WISDOM TOOTH EXTRACTION      There were no vitals filed for this visit.   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  No x-rays have been done.    How long can you sit  comfortably?  10    Patient Stated Goals  have less pain    Currently in Pain?  Yes    Pain Score  3     Pain Location  Back    Pain Orientation  Mid;Lower    Pain Descriptors / Indicators  Burning;Aching;Tightness    Pain Type  Acute pain    Pain Radiating Towards  denies    Pain Onset  More than a month ago    Pain Frequency  Constant    Aggravating Factors   sitting, pain up to 7/10    Pain Relieving Factors  tylenol, heat, lie on side with pillow between knees at best pain a 3/10    Effect of Pain on Daily Activities  just hurts, difficulty sitting         [facility] PT Assessment - 05/17/19 0001      Assessment   Medical Diagnosis  LBP    Referring Provider (PT)  Nolon Rod    Onset Date/Surgical Date  04/17/19      Precautions   Precautions  None      Balance Screen   Has the patient fallen in the past 6 months  No    Has the patient had a decrease in activity level because of a fear of falling?   No  Is the patient reluctant to leave their home because of a fear of falling?   No      Home Environment   Additional Comments  has stairs, does housework, has a 42 year old      Prior Function   Level of Independence  Independent    Vocation  Full time employment    Press photographer, mostly sitting    Leisure  was walking prior to October      Posture/Postural Control   Posture Comments  slouched, rounded      ROM / Strength   AROM / PROM / Strength  AROM;Strength      AROM   Overall AROM Comments  Lumbar ROM decreased 25% with c/o tightness      Strength   Overall Strength Comments  LE's 4-/5 with mild pain      Flexibility   Soft Tissue Assessment /Muscle Length  yes    Hamstrings  tight    Piriformis  tight      Palpation   Palpation comment  very tight in the lumbar paraapinals                Objective measurements completed on examination: See above findings.      [facility] Adult PT Treatment/Exercise - 05/17/19 0001       Modalities   Modalities  Electrical Stimulation;Moist Heat      Moist Heat Therapy   Number Minutes Moist Heat  15 Minutes    Moist Heat Location  Lumbar Spine      Electrical Stimulation   Electrical Stimulation Location  lumbar area    Electrical Stimulation Action  IFC    Electrical Stimulation Parameters  supine    Electrical Stimulation Goals  Pain             PT Education - 05/17/19 0910    Education Details  Wms flexion    Person(s) Educated  Patient    Methods  Explanation;Demonstration;Handout    Comprehension  Verbalized understanding       PT Short Term Goals - 05/17/19 0959      PT SHORT TERM GOAL #1   Title  independent with initial HEP    Time  2    Period  Weeks    Status  New        PT Long Term Goals - 05/17/19 0959      PT LONG TERM GOAL #1   Title  understand poture and body mechanics    Time  8    Status  New      PT LONG TERM GOAL #2   Title  decrease pain 50%    Time  8    Period  Weeks    Status  New      PT LONG TERM GOAL #3   Title  increase lumbar ROM to WNL's    Time  8    Period  Weeks    Status  New      PT LONG TERM GOAL #4   Title  tolerate sitting 30 minutes without increase of pain    Time  8    Period  Weeks    Status  New             Plan - 05/17/19 0911    Clinical Impression Statement  Patient reports that she has been having mid and low back pain for about two months, she is unsure of a cause  but reports that 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  She has tightness in the paraspinals.  She has some limitation in the strength.  Tight HS and piriformis    Stability/Clinical Decision Making  Stable/Uncomplicated    Clinical Decision Making  Low    Rehab Potential  Good    PT Frequency  2x / week    PT Duration  8 weeks    PT Treatment/Interventions  ADLs/Self Care Home Management;Electrical Stimulation;Moist Heat;Traction;Therapeutic activities;Therapeutic exercise;Manual  techniques;Patient/family education    PT Next Visit Plan  slowly start gym activites, may see 1x/week due to copay    Consulted and Agree with Plan of Care  Patient       Patient will benefit from skilled therapeutic intervention in order to improve the following deficits and impairments:  Improper body mechanics, Pain, Postural dysfunction, Increased muscle spasms, Decreased activity tolerance, Decreased range of motion, Decreased strength, Impaired flexibility  Visit Diagnosis: Acute bilateral low back pain without sciatica - Plan: PT plan of care cert/re-cert  Muscle spasm of back - Plan: PT plan of care cert/re-cert     Problem List Patient Active Problem List   Diagnosis Date Noted  . Acute blood loss anemia 03/20/2014  . Umbilical hernia with obstruction but no gangrene 12/18/2013    Jearld LeschALBRIGHT,Dominick Zertuche W., PT 05/17/2019, 10:02 AM  [facility] [address] [place], [place], [address] Phone: [phone]   Fax:  [phone]  Name: Lennox GrumblesFioanna S Mulcahey MRN: [id number] Date of Birth: 01/21/1976

## 2019-05-30 ENCOUNTER — Ambulatory Visit: Payer: BC Managed Care – PPO | Admitting: Physical Therapy

## 2019-06-07 ENCOUNTER — Encounter: Payer: Self-pay | Admitting: Family Medicine

## 2019-06-07 ENCOUNTER — Other Ambulatory Visit: Payer: Self-pay

## 2019-06-07 ENCOUNTER — Ambulatory Visit (INDEPENDENT_AMBULATORY_CARE_PROVIDER_SITE_OTHER): Payer: BC Managed Care – PPO | Admitting: Family Medicine

## 2019-06-07 VITALS — BP 136/74 | HR 65 | Temp 97.8°F | Resp 17 | Ht 62.0 in | Wt 189.6 lb

## 2019-06-07 DIAGNOSIS — Z23 Encounter for immunization: Secondary | ICD-10-CM

## 2019-06-07 DIAGNOSIS — R635 Abnormal weight gain: Secondary | ICD-10-CM | POA: Diagnosis not present

## 2019-06-07 DIAGNOSIS — E559 Vitamin D deficiency, unspecified: Secondary | ICD-10-CM

## 2019-06-07 DIAGNOSIS — F5101 Primary insomnia: Secondary | ICD-10-CM | POA: Diagnosis not present

## 2019-06-07 DIAGNOSIS — Z0001 Encounter for general adult medical examination with abnormal findings: Secondary | ICD-10-CM | POA: Diagnosis not present

## 2019-06-07 DIAGNOSIS — Z713 Dietary counseling and surveillance: Secondary | ICD-10-CM

## 2019-06-07 DIAGNOSIS — G478 Other sleep disorders: Secondary | ICD-10-CM | POA: Diagnosis not present

## 2019-06-07 DIAGNOSIS — Z0000 Encounter for general adult medical examination without abnormal findings: Secondary | ICD-10-CM

## 2019-06-07 DIAGNOSIS — Z13220 Encounter for screening for lipoid disorders: Secondary | ICD-10-CM

## 2019-06-07 DIAGNOSIS — Z136 Encounter for screening for cardiovascular disorders: Secondary | ICD-10-CM

## 2019-06-07 DIAGNOSIS — E039 Hypothyroidism, unspecified: Secondary | ICD-10-CM

## 2019-06-07 NOTE — Progress Notes (Signed)
Chief Complaint  Patient presents with  . Annual Exam    cpe no pap    Subjective:  Mallory Martin is a 44 y.o. female here for a health maintenance visit.  Patient is established pt   She wakes up periodically and has difficulty with sleep She wakes up but not to use the bathroom She states that she has been using tylenol pm She drinks coffee in the morning about 10 oz She drink soda  She states that her husband told her that she snores. Wt Readings from Last 3 Encounters:  06/07/19 189 lb 9.6 oz (86 kg)  04/18/19 184 lb 9.6 oz (83.7 kg)  09/30/17 181 lb 3.2 oz (82.2 kg)  Body mass index is 34.68 kg/m.    Patient Active Problem List   Diagnosis Date Noted  . Acute blood loss anemia 03/20/2014  . Umbilical hernia with obstruction but no gangrene 12/18/2013    Past Medical History:  Diagnosis Date  . Allergy   . Anemia   . Asthma   . Constipation   . Migraines    menstrual migraines  . Postpartum care following cesarean delivery (10/24) 03/17/2014    Past Surgical History:  Procedure Laterality Date  . CESAREAN SECTION  2002  . CESAREAN SECTION N/A 03/17/2014   Procedure: CESAREAN SECTION;  Surgeon: Tresa Endo A. Ernestina Penna, MD;  Location: [facility];  Service: Obstetrics;  Laterality: N/A;  repeat c-section  . HERNIA REPAIR    . INSERTION OF MESH N/A 04/14/2016   Procedure: INSERTION OF MESH;  Surgeon: Claud Kelp, MD;  Location: [facility];  Service: General;  Laterality: N/A;  INSERTION OF MESH  . UMBILICAL HERNIA REPAIR N/A 04/14/2016   Procedure: OPEN REPAIR INCARCERATED UMBILICAL HERNIA WITH MESH;  Surgeon: Claud Kelp, MD;  [facility];  Service: General;  Laterality: N/A;  OPEN REPAIR INCARCERATED UMBILICAL HERNIA WITH MESH  . WISDOM TOOTH EXTRACTION       Outpatient Medications Prior to Visit  Medication Sig Dispense Refill  . acetaminophen (TYLENOL) 500 MG tablet Take by mouth.    . levothyroxine (SYNTHROID) 50 MCG  tablet Take 50 mcg by mouth daily.    . Multiple Vitamins-Minerals (MULTIVITAMIN WITH MINERALS) tablet Take 1 tablet by mouth daily.    . Lidocaine HCl (ASPERCREME LIDOCAINE) 4 % LIQD Apply 1 application topically 2 (two) times daily. (Patient not taking: Reported on 06/07/2019) 60 mL 3   No facility-administered medications prior to visit.    Allergies  Allergen Reactions  . Advil [Ibuprofen] Swelling  . Excedrin Extra Strength [Aspirin-Acetaminophen-Caffeine] Swelling    Excedrin Migraine  . Excedrin Migraine  [Asa-Apap-Caff Buffered] Swelling  . Oxycodone Nausea Only  . Vicodin [Hydrocodone-Acetaminophen] Nausea Only     Family History  Problem Relation Age of Onset  . Cancer Mother   . Heart disease Mother   . Mental illness Sister      Health Habits: Dental Exam: up to date Eye Exam: up to date   Social History   Socioeconomic History  . Marital status: Married    Spouse name: Not on file  . Number of children: Not on file  . Years of education: Not on file  . Highest education level: Not on file  Occupational History  . Not on file  Tobacco Use  . Smoking status: Never Smoker  . Smokeless tobacco: Never Used  Substance and Sexual Activity  . Alcohol use: No  . Drug use: No  .  Sexual activity: Yes    Birth control/protection: Condom  Other Topics Concern  . Not on file  Social History Narrative  . Not on file   Social Determinants of Health   Financial Resource Strain:   . Difficulty of Paying Living Expenses: Not on file  Food Insecurity:   . Worried About Programme researcher, broadcasting/film/video in the Last Year: Not on file  . Ran Out of Food in the Last Year: Not on file  Transportation Needs:   . Lack of Transportation (Medical): Not on file  . Lack of Transportation (Non-Medical): Not on file  Physical Activity:   . Days of Exercise per Week: Not on file  . Minutes of Exercise per Session: Not on file  Stress:   . Feeling of Stress : Not on file  Social  Connections:   . Frequency of Communication with Friends and Family: Not on file  . Frequency of Social Gatherings with Friends and Family: Not on file  . Attends Religious Services: Not on file  . Active Member of Clubs or Organizations: Not on file  . Attends Banker Meetings: Not on file  . Marital Status: Not on file  Intimate Partner Violence:   . Fear of Current or Ex-Partner: Not on file  . Emotionally Abused: Not on file  . Physically Abused: Not on file  . Sexually Abused: Not on file   Social History   Substance and Sexual Activity  Alcohol Use No   Social History   Tobacco Use  Smoking Status Never Smoker  Smokeless Tobacco Never Used   Social History   Substance and Sexual Activity  Drug Use No    GYN: Sexual Health Menstrual status: regular menses LMP: Patient's last menstrual period was 06/07/2019. Last pap smear: see HM section History of abnormal pap smears:   Health Maintenance: See under health Maintenance activity for review of completion dates as well. Immunization History  Administered Date(s) Administered  . Pneumococcal Polysaccharide-23 03/19/2014  . Rabies, IM 12/02/2011  . Rabies, intradermal 12/02/2011      Depression Screen-PHQ2/9 Depression screen [facility] 2/9 06/07/2019 04/18/2019 09/30/2017 12/28/2013 11/16/2013  Decreased Interest 0 0 0 0 0  Down, Depressed, Hopeless 0 0 0 0 0  PHQ - 2 Score 0 0 0 0 0       Depression Severity and Treatment Recommendations:  0-4= None  5-9= Mild / Treatment: Support, educate to call if worse; return in one month  10-14= Moderate / Treatment: Support, watchful waiting; Antidepressant or Psycotherapy  15-19= Moderately severe / Treatment: Antidepressant OR Psychotherapy  >= 20 = Major depression, severe / Antidepressant AND Psychotherapy    Review of Systems   ROS  See HPI for ROS as well.   Review of Systems  Constitutional: Negative for activity change, appetite change, chills  and fever.  HENT: Negative for congestion, nosebleeds, trouble swallowing and voice change.   Respiratory: Negative for cough, shortness of breath and wheezing.   Gastrointestinal: Negative for diarrhea, nausea and vomiting.  Genitourinary: Negative for difficulty urinating, dysuria, flank pain and hematuria.  Musculoskeletal: Negative for back pain, joint swelling and neck pain.  Neurological: Negative for dizziness, speech difficulty, light-headedness and numbness.  See HPI. All other review of systems negative.   Objective:   Vitals:   06/07/19 1338  BP: 136/74  Pulse: 65  Resp: 17  Temp: 97.8 F (36.6 C)  TempSrc: Oral  SpO2: 100%  Weight: 189 lb 9.6 oz (86 kg)  Height: 5\' 2"  (1.575 m)    Body mass index is 34.68 kg/m.  Physical Exam Constitutional:      Appearance: Normal appearance. She is obese.  HENT:     Head: Normocephalic and atraumatic.  Eyes:     Extraocular Movements: Extraocular movements intact.     Conjunctiva/sclera: Conjunctivae normal.  Neck:     Comments: No thyromegaly Cardiovascular:     Rate and Rhythm: Normal rate and regular rhythm.     Pulses: Normal pulses.  Pulmonary:     Effort: Pulmonary effort is normal. No respiratory distress.     Breath sounds: Normal breath sounds. No wheezing or rales.  Abdominal:     General: Bowel sounds are normal. There is no distension.     Palpations: Abdomen is soft. There is no mass.     Tenderness: There is no abdominal tenderness. There is no right CVA tenderness, left CVA tenderness, guarding or rebound.     Hernia: No hernia is present.  Musculoskeletal:     Cervical back: Normal range of motion and neck supple.  Skin:    General: Skin is warm.     Capillary Refill: Capillary refill takes less than 2 seconds.  Neurological:     Mental Status: She is alert and oriented to person, place, and time.     Cranial Nerves: No cranial nerve deficit.  Psychiatric:        Mood and Affect: Mood normal.         Behavior: Behavior normal.        Thought Content: Thought content normal.        Judgment: Judgment normal.        Assessment/Plan:   Patient was seen for a health maintenance exam.  Counseled the patient on health maintenance issues. Reviewed her health mainteance schedule and ordered appropriate tests (see orders.) Counseled on regular exercise and weight management. Recommend regular eye exams and dental cleaning.   The following issues were addressed today for health maintenance:   Mallory Martin was seen today for annual exam.  Diagnoses and all orders for this visit:  Encounter for health maintenance examination in adult- Women's Health Maintenance Plan Advised monthly breast exam and annual mammogram Advised dental exam every six months Discussed stress management Discussed pap smear screening guidelines   Need for prophylactic vaccination and inoculation against influenza -     Flu Vaccine QUAD 36+ mos IM  Need for Tdap vaccination -     Tdap vaccine greater than or equal to 7yo IM  Poor sleep pattern -     Ambulatory referral to Sleep Studies  Primary insomnia- discussed obesity, deficiency as risk factors for insomnia -     Ambulatory referral to Sleep Studies -     CBC -     Ferritin -     TSH -     Vitamin D 25 (osteoporosis screening)  Weight gain- discussed heart disease prevention -     Ambulatory referral to Sleep Studies -     Lipid panel -     Hemoglobin A1c  Encounter for lipid screening for cardiovascular disease -     Lipid panel  Vitamin D deficiency- will check  -     Vitamin D 25 (osteoporosis screening)  Acquired hypothyroidism -     TSH  Encounter for nutritional counseling - provided counseling for 5 minutes Goals:  1. Lose 20 pounds in six months  2. Consistently keeping the weight off   No follow-ups on file.    Body mass index is 34.68 kg/m.:  Discussed the patient's BMI with patient. The BMI body mass index  is 34.68 kg/m.     Future Appointments  Date Time Provider Department Center  06/08/2019  5:00 PM Payseur, Marzella Schlein, PTA [facility]    Patient Instructions       If you have lab work done today you will be contacted with your lab results within the next 2 weeks.  If you have not heard from [place] then please contact us. The fastest way to get your results is to register for My Chart.   IF you received an x-ray today, you will receive an invoice from [facility]. Please contact [facility] at [phone] with questions or concerns regarding your invoice.   IF you received labwork today, you will receive an invoice from [facility]. Please contact [facility] at [phone] with questions or concerns regarding your invoice.   Our billing staff will not be able to assist you with questions regarding bills from these companies.  You will be contacted with the lab results as soon as they are available. The fastest way to get your results is to activate your My Chart account. Instructions are located on the last page of this paperwork. If you have not heard from [place] regarding the results in 2 weeks, please contact this office.

## 2019-06-07 NOTE — Patient Instructions (Addendum)
To Do List  1. Use [employer]  To track foods 2.  We will get the sleep study to see what is going on with your sleep 3. Cut down on grazing and snacking by packing a lunch and packing sensible snacks. 4. Exercise for 10 minutes a day 5. Next visit: 4 weeks virtual visit for weight loss (this will be a video visit so you will get a link to your phone)   If you have lab work done today you will be contacted with your lab results within the next 2 weeks.  If you have not heard from [place] then please contact us. The fastest way to get your results is to register for My Chart.   IF you received an x-ray today, you will receive an invoice from [facility]. Please contact [facility]iology at [phone] with questions or concerns regarding your invoice.   IF you received labwork today, you will receive an invoice from [facility]. Please contact [facility] at [phone] with questions or concerns regarding your invoice.   Our billing staff will not be able to assist you with questions regarding bills from these companies.  You will be contacted with the lab results as soon as they are available. The fastest way to get your results is to activate your My Chart account. Instructions are located on the last page of this paperwork. If you have not heard from [place] regarding the results in 2 weeks, please contact this office.

## 2019-06-08 ENCOUNTER — Ambulatory Visit: Payer: BC Managed Care – PPO | Admitting: Physical Therapy

## 2019-06-08 LAB — HEMOGLOBIN A1C
Est. average glucose Bld gHb Est-mCnc: 108 mg/dL
Hgb A1c MFr Bld: 5.4 % (ref 4.8–5.6)

## 2019-06-08 LAB — LIPID PANEL
Chol/HDL Ratio: 2.8 ratio (ref 0.0–4.4)
Cholesterol, Total: 191 mg/dL (ref 100–199)
HDL: 68 mg/dL (ref >39)
LDL Chol Calc (NIH): 111 mg/dL — ABNORMAL HIGH (ref 0–99)
Triglycerides: 66 mg/dL (ref 0–149)
VLDL Cholesterol Cal: 12 mg/dL (ref 5–40)

## 2019-06-08 LAB — CBC
Hematocrit: 38.4 % (ref 34.0–46.6)
Hemoglobin: 12.5 g/dL (ref 11.1–15.9)
MCH: 27.8 pg (ref 26.6–33.0)
MCHC: 32.6 g/dL (ref 31.5–35.7)
MCV: 85 fL (ref 79–97)
Platelets: 398 10*3/uL (ref 150–450)
RBC: 4.5 x10E6/uL (ref 3.77–5.28)
RDW: 12.3 % (ref 11.7–15.4)
WBC: 6.6 10*3/uL (ref 3.4–10.8)

## 2019-06-08 LAB — VITAMIN D 25 HYDROXY (VIT D DEFICIENCY, FRACTURES): Vit D, 25-Hydroxy: 35.6 ng/mL (ref 30.0–100.0)

## 2019-06-08 LAB — FERRITIN: Ferritin: 22 ng/mL (ref 15–150)

## 2019-06-08 LAB — TSH: TSH: 2.89 u[IU]/mL (ref 0.450–4.500)

## 2019-06-12 ENCOUNTER — Ambulatory Visit: Payer: BC Managed Care – PPO | Attending: Family Medicine | Admitting: Physical Therapy

## 2019-06-12 ENCOUNTER — Other Ambulatory Visit: Payer: Self-pay

## 2019-06-12 DIAGNOSIS — M545 Low back pain, unspecified: Secondary | ICD-10-CM

## 2019-06-12 DIAGNOSIS — M62830 Muscle spasm of back: Secondary | ICD-10-CM | POA: Diagnosis present

## 2019-06-12 NOTE — Therapy (Signed)
[facility] [address] [place], [place], [address] Phone: [phone]   Fax:  [phone]  Physical Therapy Treatment  Patient Details  Name: Mallory Martin MRN: [id number] Date of Birth: [birth year] Referring Provider (PT): Creta Levin   Encounter Date: 06/12/2019  PT End of Session - 06/12/19 0917    Visit Number  2    Date for PT Re-Evaluation  07/18/19    PT Start Time  0852    PT Stop Time  0936    PT Time Calculation (min)  44 min       Past Medical History:  Diagnosis Date  . Allergy   . Anemia   . Asthma   . Constipation   . Migraines    menstrual migraines  . Postpartum care following cesarean delivery (10/24) 03/17/2014    Past Surgical History:  Procedure Laterality Date  . CESAREAN SECTION  2002  . CESAREAN SECTION N/A 03/17/2014   Procedure: CESAREAN SECTION;  Surgeon: Tresa Endo A. Ernestina Penna, MD;  Location: [facility];  Service: Obstetrics;  Laterality: N/A;  repeat c-section  . HERNIA REPAIR    . INSERTION OF MESH N/A 04/14/2016   Procedure: INSERTION OF MESH;  Surgeon: Claud Kelp, MD;  Location: [facility];  Service: General;  Laterality: N/A;  INSERTION OF MESH  . UMBILICAL HERNIA REPAIR N/A 04/14/2016   Procedure: OPEN REPAIR INCARCERATED UMBILICAL HERNIA WITH MESH;  Surgeon: Claud Kelp, MD;  Location: [facility];  Service: General;  Laterality: N/A;  OPEN REPAIR INCARCERATED UMBILICAL HERNIA WITH MESH  . WISDOM TOOTH EXTRACTION      There were no vitals filed for this visit.  Subjective Assessment - 06/12/19 0853    Subjective  doing okay and stretches are helping. prolonged sitting is the worst    Currently in Pain?  Yes    Pain Score  3     Pain Location  Back    Pain Orientation  Mid;Lower                       [facility] Adult PT Treatment/Exercise - 06/12/19 0001      Exercises   Exercises  Lumbar;Knee/Hip      Lumbar Exercises:  Standing   Other Standing Lumbar Exercises  red tband scap 3 way 10 each    Other Standing Lumbar Exercises  wt ball OH ext and obl 10 each      Lumbar Exercises: Seated   Sit to Stand  10 reps   yellow wt ball chest press     Lumbar Exercises: Supine   Ab Set  10 reps   with ball   Bridge  10 reps;Non-compliant      Knee/Hip Exercises: Standing   Hip Abduction  Stengthening;Both;10 reps;Knee straight   red tband   Hip Extension  Stengthening;Both;10 reps;Knee straight   red tband     Modalities   Modalities  Electrical Stimulation;Moist Heat      Moist Heat Therapy   Number Minutes Moist Heat  15 Minutes    Moist Heat Location  Lumbar Spine      Electrical Stimulation   Electrical Stimulation Location  lumbar area    Electrical Stimulation Action  IFC    Electrical Stimulation Parameters  supine    Electrical Stimulation Goals  Pain      Manual Therapy   Manual Therapy  Passive ROM    Passive ROM  LE  and trunk               PT Short Term Goals - 05/17/19 0959      PT SHORT TERM GOAL #1   Title  independent with initial HEP    Time  2    Period  Weeks    Status  New        PT Long Term Goals - 05/17/19 0959      PT LONG TERM GOAL #1   Title  understand poture and body mechanics    Time  8    Status  New      PT LONG TERM GOAL #2   Title  decrease pain 50%    Time  8    Period  Weeks    Status  New      PT LONG TERM GOAL #3   Title  increase lumbar ROM to WNL's    Time  8    Period  Weeks    Status  New      PT LONG TERM GOAL #4   Title  tolerate sitting 30 minutes without increase of pain    Time  8    Period  Weeks    Status  New            Plan - 06/12/19 0919    Clinical Impression Statement  pt tolerated initial progression on ex fair, some increased pain noted and cuing for posture and core activation. Cramping with KTC with ball. Tightnes in LE with PROM but tolerated well. Continues to verb relief with MH/estim ( TENS  info given)    PT Treatment/Interventions  ADLs/Self Care Home Management;Electrical Stimulation;Moist Heat;Traction;Therapeutic activities;Therapeutic exercise;Manual techniques;Patient/family education    PT Next Visit Plan  slowly start gym activites, may see 1x/week due to copay. assess response to ex and add to HEP       Patient will benefit from skilled therapeutic intervention in order to improve the following deficits and impairments:  Improper body mechanics, Pain, Postural dysfunction, Increased muscle spasms, Decreased activity tolerance, Decreased range of motion, Decreased strength, Impaired flexibility  Visit Diagnosis: Acute bilateral low back pain without sciatica  Muscle spasm of back     Problem List Patient Active Problem List   Diagnosis Date Noted  . Acute blood loss anemia 03/20/2014  . Umbilical hernia with obstruction but no gangrene 12/18/2013    Braelin Costlow,ANGIE PTA 06/12/2019, 9:20 AM  [address] [place], [place], [address] Phone: [phone]   Fax:  [phone]  Name: Mallory Martin MRN: [id number] Date of Birth: 22-Mar-1976

## 2019-06-14 ENCOUNTER — Other Ambulatory Visit: Payer: Self-pay

## 2019-06-14 ENCOUNTER — Encounter: Payer: Self-pay | Admitting: Neurology

## 2019-06-14 ENCOUNTER — Ambulatory Visit (INDEPENDENT_AMBULATORY_CARE_PROVIDER_SITE_OTHER): Payer: BC Managed Care – PPO | Admitting: Neurology

## 2019-06-14 VITALS — BP 131/79 | HR 57 | Temp 97.5°F | Ht 62.0 in | Wt 190.3 lb

## 2019-06-14 DIAGNOSIS — G479 Sleep disorder, unspecified: Secondary | ICD-10-CM

## 2019-06-14 DIAGNOSIS — E669 Obesity, unspecified: Secondary | ICD-10-CM

## 2019-06-14 DIAGNOSIS — R0683 Snoring: Secondary | ICD-10-CM

## 2019-06-14 DIAGNOSIS — G2581 Restless legs syndrome: Secondary | ICD-10-CM

## 2019-06-14 DIAGNOSIS — R519 Headache, unspecified: Secondary | ICD-10-CM

## 2019-06-14 DIAGNOSIS — R258 Other abnormal involuntary movements: Secondary | ICD-10-CM

## 2019-06-14 DIAGNOSIS — G4700 Insomnia, unspecified: Secondary | ICD-10-CM

## 2019-06-14 NOTE — Patient Instructions (Signed)

## 2019-06-14 NOTE — Progress Notes (Signed)
Subjective:    Patient ID: Mallory Martin is a 44 y.o. female.  HPI     Huston Foley, MD, PhD [facility] [address], [address] [address] [place], [place] [address]  Dear Dr. Creta Levin, I saw your patient, Mallory Martin, upon your kind request in my sleep clinic today for initial consultation of Mallory Martin sleep disorder, in particular, concern for underlying obstructive sleep apnea.  The patient is unaccompanied today.  As you know, [name] is a 43 year old right-handed woman with an underlying history of asthma, allergies, anemia, migraine headaches, hypothyroidism, and obesity, who reports snoring and difficulty initiating and maintaining sleep for the past few months.  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  Mallory Martin lives with Mallory Martin family including Mallory Martin husband and 10-year-old daughter, Mallory Martin has a 55 year old son as well.  Mallory Martin says that sometimes Mallory Martin 10-year-old daughter sleeps in the bed with Mallory Martin.  Mallory Martin husband works nights.  Mallory Martin has been told that Mallory Martin snores.  Mallory Martin has woken up with a headache occasionally.  Mallory Martin has tried over-the-counter medication, melatonin however caused abnormal dreams and bad dreams, Mallory Martin currently takes either Tylenol PM or Benadryl depending on whether or not Mallory Martin also feels congested.  Mallory Martin takes some over-the-counter medication 3-4 nights out of the week.  Mallory Martin tries to be in bed between 9 and 9:30 PM and rise time is between 5:45 AM and 6:05 AM.  Mallory Martin has woken up with a headache.  Mallory Martin does not have night to night nocturia but has had intermittent restless leg symptoms and the need to move Mallory Martin legs at night, Mallory Martin husband has complained about Mallory Martin restlessness and leg movements at night.  Mallory Martin has no obvious family history of OSA.  Mallory Martin had a tonsillectomy at age 41.  Mallory Martin is currently working on weight loss.  Mallory Martin drinks 1 cup of coffee, larger size once a day in the morning, otherwise no caffeine on a day-to-day basis.  Mallory Martin is a  non-smoker and does not typically utilize any alcohol.  Mallory Martin has a TV in the bedroom, typically on a sleep timer. I reviewed your office note from 06/07/2019.  Mallory Martin Epworth sleepiness score is 2 out of 24, fatigue severity score is 31 out of 63.  Mallory Martin Past Medical History Is Significant For: Past Medical History:  Diagnosis Date  . Allergy   . Anemia   . Asthma   . Constipation   . Migraines    menstrual migraines  . Postpartum care following cesarean delivery (10/24) 03/17/2014    Mallory Martin Past Surgical History Is Significant For: Past Surgical History:  Procedure Laterality Date  . CESAREAN SECTION  2002  . CESAREAN SECTION N/A 03/17/2014   Procedure: CESAREAN SECTION;  Surgeon: Tresa Endo A. Ernestina Penna, MD;  Location: [facility];  Service: Obstetrics;  Laterality: N/A;  repeat c-section  . HERNIA REPAIR    . INSERTION OF MESH N/A 04/14/2016   Procedure: INSERTION OF MESH;  Surgeon: Claud Kelp, MD;  Location: [facility];  Service: General;  Laterality: N/A;  INSERTION OF MESH  . UMBILICAL HERNIA REPAIR N/A 04/14/2016   Procedure: OPEN REPAIR INCARCERATED UMBILICAL HERNIA WITH MESH;  Surgeon: Claud Kelp, MD;  Location: [facility];  Service: General;  Laterality: N/A;  OPEN REPAIR INCARCERATED UMBILICAL HERNIA WITH MESH  . WISDOM TOOTH EXTRACTION      Mallory Martin Family History Is Significant For: Family History  Problem Relation Age of Onset  .  Cancer Mother   . Heart disease Mother   . Mental illness Sister     Mallory Martin Social History Is Significant For: Social History   Socioeconomic History  . Marital status: Married    Spouse name: Not on file  . Number of children: Not on file  . Years of education: Not on file  . Highest education level: Not on file  Occupational History  . Not on file  Tobacco Use  . Smoking status: Never Smoker  . Smokeless tobacco: Never Used  Substance and Sexual Activity  . Alcohol use: No  . Drug use: No  . Sexual activity: Yes     Birth control/protection: Condom  Other Topics Concern  . Not on file  Social History Narrative  . Not on file   Social Determinants of Health   Financial Resource Strain:   . Difficulty of Paying Living Expenses: Not on file  Food Insecurity:   . Worried About Programme researcher, broadcasting/film/video in the Last Year: Not on file  . Ran Out of Food in the Last Year: Not on file  Transportation Needs:   . Lack of Transportation (Medical): Not on file  . Lack of Transportation (Non-Medical): Not on file  Physical Activity:   . Days of Exercise per Week: Not on file  . Minutes of Exercise per Session: Not on file  Stress:   . Feeling of Stress : Not on file  Social Connections:   . Frequency of Communication with Friends and Family: Not on file  . Frequency of Social Gatherings with Friends and Family: Not on file  . Attends Religious Services: Not on file  . Active Member of Clubs or Organizations: Not on file  . Attends Banker Meetings: Not on file  . Marital Status: Not on file    Mallory Martin Allergies Are:  Allergies  Allergen Reactions  . Advil [Ibuprofen] Swelling  . Excedrin Extra Strength [Aspirin-Acetaminophen-Caffeine] Swelling    Excedrin Migraine  . Excedrin Migraine  [Asa-Apap-Caff Buffered] Swelling  . Oxycodone Nausea Only  . Vicodin [Hydrocodone-Acetaminophen] Nausea Only  :   Mallory Martin Current Medications Are:  Outpatient Encounter Medications as of 06/14/2019  Medication Sig  . acetaminophen (TYLENOL) 500 MG tablet Take by mouth.  . Ascorbic Acid (VITAMIN C PO) Take by mouth.  . levothyroxine (SYNTHROID) 50 MCG tablet Take 50 mcg by mouth daily.  . Multiple Vitamins-Minerals (MULTIVITAMIN WITH MINERALS) tablet Take 1 tablet by mouth daily.  [name] VITAMIN D PO Take by mouth.  . [DISCONTINUED] Lidocaine HCl (ASPERCREME LIDOCAINE) 4 % LIQD Apply 1 application topically 2 (two) times daily. (Patient not taking: Reported on 06/07/2019)   No facility-administered encounter  medications on file as of 06/14/2019.  :  Review of Systems:  Out of a complete 14 point review of systems, all are reviewed and negative with the exception of these symptoms as listed below: Review of Systems  Neurological:       Here for sleep consult. No prior sleep study. Epworth Sleepiness Scale 0= would never doze 1= slight chance of dozing 2= moderate chance of dozing 3= high chance of dozing  Sitting and reading:0 Watching TV:1 Sitting inactive in a public place (ex. Theater or meeting):0 As a passenger in a car for an hour without a break:0 Lying down to rest in the afternoon:1 Sitting and talking to someone:0 Sitting quietly after lunch (no alcohol):0 In a car, while stopped in traffic:0 Total:2     Objective:  Neurological  Exam  Physical Exam Physical Examination:   Vitals:   06/14/19 1518  BP: 131/79  Pulse: (!) 57  Temp: (!) 97.5 F (36.4 C)    General Examination: The patient is a very pleasant 44 y.o. female in no acute distress. Mallory Martin appears well-developed and well-nourished and well groomed.   HEENT: Normocephalic, atraumatic, pupils are equal, round and reactive to light, extraocular tracking is good without limitation to gaze excursion or nystagmus noted. Hearing is grossly intact. Face is symmetric with normal facial animation. Speech is clear with no dysarthria noted. There is no hypophonia. There is no lip, neck/head, jaw or voice tremor. Neck is supple with full range of passive and active motion. There are no carotid bruits on auscultation. Oropharynx exam reveals: mild mouth dryness, good dental hygiene and mild airway crowding, due to Small airway entry, possible residual tonsillar tissue bilaterally, Mallampati class II, tongue protrudes centrally and palate elevates symmetrically, neck circumference is 14 and three-quarter inches.  Mallory Martin has a minimal overbite.  Chest: Clear to auscultation without wheezing, rhonchi or crackles noted.  Heart:  S1+S2+0, regular and normal without murmurs, rubs or gallops noted.   Abdomen: Soft, non-tender and non-distended with normal bowel sounds appreciated on auscultation.  Extremities: There is no pitting edema in the distal lower extremities bilaterally.   Skin: Warm and dry without trophic changes noted.   Musculoskeletal: exam reveals no obvious joint deformities, tenderness or joint swelling or erythema.   Neurologically:  Mental status: The patient is awake, alert and oriented in all 4 spheres. Mallory Martin immediate and remote memory, attention, language skills and fund of knowledge are appropriate. There is no evidence of aphasia, agnosia, apraxia or anomia. Speech is clear with normal prosody and enunciation. Thought process is linear. Mood is normal and affect is normal.  Cranial nerves II - XII are as described above under HEENT exam.  Motor exam: Normal bulk, strength and tone is noted. There is no tremor, Romberg is negative. Reflexes are 2+ throughout. Fine motor skills and coordination: grossly intact.  Cerebellar testing: No dysmetria or intention tremor. There is no truncal or gait ataxia.  Sensory exam: intact to light touch in the upper and lower extremities.  Gait, station and balance: Mallory Martin stands easily. No veering to one side is noted. No leaning to one side is noted. Posture is age-appropriate and stance is narrow based. Gait shows normal stride length and normal pace. No problems turning are noted. Tandem walk is unremarkable.                Assessment and Plan:  In summary, LISSY DEUSER is a very pleasant 44 y.o.-year old female with an underlying history of asthma, allergies, anemia, migraine headaches, hypothyroidism, and obesity, who Presents for evaluation of Mallory Martin sleep disturbance, particularly Mallory Martin difficulty initiating and maintaining sleep for the past few months.  Mallory Martin does report snoring and a sleep study would help rule out an underlying organic cause of Mallory Martin sleep disturbance,  Mallory Martin also reports intermittent symptoms of restless leg syndrome and leg twitching or kicking at night per husband's feedback.  Mallory Martin is advised to proceed with a sleep study.  I had a long chat with the patient about my findings and the diagnosis of OSA, its prognosis and treatment options. We talked about medical treatments, surgical interventions and non-pharmacological approaches. I explained in particular the risks and ramifications of untreated moderate to severe OSA, especially with respect to developing cardiovascular disease down the Road, including congestive heart  failure, difficult to treat hypertension, cardiac arrhythmias, or stroke. Even type 2 diabetes has, in part, been linked to untreated OSA. Symptoms of untreated OSA include daytime sleepiness, memory problems, mood irritability and mood disorder such as depression and anxiety, lack of energy, as well as recurrent headaches, especially morning headaches. We talked about trying to maintain a healthy lifestyle in general, as well as the importance of weight control. We also talked about the importance of good sleep hygiene. I recommended the following at this time: sleep study.   I explained the sleep test procedure to the patient and also outlined possible surgical and non-surgical treatment options of OSA, including the use of a custom-made dental device (which would require a referral to a specialist dentist or oral surgeon), upper airway surgical options, such as traditional UPPP or a novel less invasive surgical option in the form of Inspire hypoglossal nerve stimulation (which would involve a referral to an ENT surgeon). I also explained the CPAP treatment option to the patient, who indicated that Mallory Martin would be willing to try CPAP if the need arises. I explained the importance of being compliant with PAP treatment, not only for insurance purposes but primarily to improve Mallory Martin symptoms, and for the patient's long term health benefit, including  to reduce Mallory Martin cardiovascular risks. I answered all Mallory Martin questions today and the patient was in agreement. I plan to see Mallory Martin back after the sleep study is completed and encouraged Mallory Martin to call with any interim questions, concerns, problems or updates.   Thank you very much for allowing me to participate in the care of this nice patient. If I can be of any further assistance to you please do not hesitate to call me at [phone].  Sincerely,   Star Age, MD, PhD

## 2019-07-06 ENCOUNTER — Ambulatory Visit (INDEPENDENT_AMBULATORY_CARE_PROVIDER_SITE_OTHER): Payer: BC Managed Care – PPO | Admitting: Neurology

## 2019-07-06 ENCOUNTER — Other Ambulatory Visit: Payer: Self-pay

## 2019-07-06 DIAGNOSIS — R519 Headache, unspecified: Secondary | ICD-10-CM

## 2019-07-06 DIAGNOSIS — G4733 Obstructive sleep apnea (adult) (pediatric): Secondary | ICD-10-CM | POA: Diagnosis not present

## 2019-07-06 DIAGNOSIS — R0683 Snoring: Secondary | ICD-10-CM

## 2019-07-06 DIAGNOSIS — E669 Obesity, unspecified: Secondary | ICD-10-CM

## 2019-07-06 DIAGNOSIS — R258 Other abnormal involuntary movements: Secondary | ICD-10-CM

## 2019-07-06 DIAGNOSIS — G479 Sleep disorder, unspecified: Secondary | ICD-10-CM

## 2019-07-06 DIAGNOSIS — G4700 Insomnia, unspecified: Secondary | ICD-10-CM

## 2019-07-06 DIAGNOSIS — G2581 Restless legs syndrome: Secondary | ICD-10-CM

## 2019-07-11 ENCOUNTER — Other Ambulatory Visit: Payer: Self-pay

## 2019-07-11 ENCOUNTER — Encounter: Payer: Self-pay | Admitting: Family Medicine

## 2019-07-11 ENCOUNTER — Telehealth (INDEPENDENT_AMBULATORY_CARE_PROVIDER_SITE_OTHER): Payer: BC Managed Care – PPO | Admitting: Family Medicine

## 2019-07-11 VITALS — Wt 188.0 lb

## 2019-07-11 DIAGNOSIS — R635 Abnormal weight gain: Secondary | ICD-10-CM

## 2019-07-11 DIAGNOSIS — F5101 Primary insomnia: Secondary | ICD-10-CM | POA: Diagnosis not present

## 2019-07-11 NOTE — Patient Instructions (Signed)
° ° ° °  If you have lab work done today you will be contacted with your lab results within the next 2 weeks.  If you have not heard from us then please contact us. The fastest way to get your results is to register for My Chart. ° ° °IF you received an x-ray today, you will receive an invoice from [facility]. Please contact [facility] at [phone] with questions or concerns regarding your invoice.  ° °IF you received labwork today, you will receive an invoice from [facility]. Please contact [facility] at [phone] with questions or concerns regarding your invoice.  ° °Our billing staff will not be able to assist you with questions regarding bills from these companies. ° °You will be contacted with the lab results as soon as they are available. The fastest way to get your results is to activate your My Chart account. Instructions are located on the last page of this paperwork. If you have not heard from us regarding the results in 2 weeks, please contact this office. °  ° ° ° °

## 2019-07-11 NOTE — Progress Notes (Signed)
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 In addition, patient identity was confirmed by use of name plus two identifiers (DOB and address).  I discussed the limitations, risks, security and privacy concerns of performing an evaluation and management service by telephone and the availability of in person appointments. I also discussed with the patient that there may be a patient responsible charge related to this service. The patient expressed understanding and agreed to proceed.  I spent a total of TIME; 0 MIN TO 60 MIN: 15 minutes talking with the patient or their proxy.  Chief Complaint  Patient presents with  . Follow-up    insomnia and weight gain.  No other concerns    Subjective   Mallory Martin is a 44 y.o. established patient. Telephone visit today for  HPI   Insomnia Pt reports that she did the sleep study her study was done last Thursday 07/06/2019 and that she slept better than she did at home but woke up because of the wires.  She takes her thyroid medication She feels like she tosses and turns all night Melatonin is not working  Obesity She reports that her weight is 188 She reports that she is exercising She increased her water intake She is doing intermittent cardio throughout the day. She is still taking her thyroid  Wt Readings from Last 3 Encounters:  06/14/19 190 lb 5 oz (86.3 kg)  06/07/19 189 lb 9.6 oz (86 kg)  04/18/19 184 lb 9.6 oz (83.7 kg)    Patient Active Problem List   Diagnosis Date Noted  . Acute blood loss anemia 03/20/2014  . Umbilical hernia with obstruction but no gangrene  12/18/2013    Past Medical History:  Diagnosis Date  . Allergy   . Anemia   . Asthma   . Constipation   . Migraines    menstrual migraines  . Postpartum care following cesarean delivery (10/24) 03/17/2014    Current Outpatient Medications  Medication Sig Dispense Refill  . acetaminophen (TYLENOL) 500 MG tablet Take by mouth.    . Ascorbic Acid (VITAMIN C PO) Take by mouth.    . levothyroxine (SYNTHROID) 50 MCG tablet Take 50 mcg by mouth daily.    . Multiple Vitamins-Minerals (MULTIVITAMIN WITH MINERALS) tablet Take 1 tablet by mouth daily.    [name] VITAMIN D PO Take by mouth.     No current facility-administered medications for this visit.    Allergies  Allergen Reactions  . Advil [Ibuprofen] Swelling  . Excedrin Extra Strength [Aspirin-Acetaminophen-Caffeine] Swelling    Excedrin Migraine  . Excedrin Migraine  [Asa-Apap-Caff Buffered] Swelling  . Oxycodone Nausea Only  . Vicodin [Hydrocodone-Acetaminophen] Nausea Only    Social History   Socioeconomic History  . Marital status: Married    Spouse name: Not on file  . Number of children: Not on file  . Years of education: Not on file  . Highest education level: Not on file  Occupational History  . Not on file  Tobacco Use  . Smoking status: Never Smoker  . Smokeless tobacco: Never Used  Substance and Sexual Activity  . Alcohol use: No  . Drug use: No  .  Sexual activity: Yes    Birth control/protection: Condom  Other Topics Concern  . Not on file  Social History Narrative  . Not on file   Social Determinants of Health   Financial Resource Strain:   . Difficulty of Paying Living Expenses: Not on file  Food Insecurity:   . Worried About Charity fundraiser in the Last Year: Not on file  . Ran Out of Food in the Last Year: Not on file  Transportation Needs:   . Lack of Transportation (Medical): Not on file  . Lack of Transportation (Non-Medical): Not on file  Physical Activity:   . Days of Exercise per  Week: Not on file  . Minutes of Exercise per Session: Not on file  Stress:   . Feeling of Stress : Not on file  Social Connections:   . Frequency of Communication with Friends and Family: Not on file  . Frequency of Social Gatherings with Friends and Family: Not on file  . Attends Religious Services: Not on file  . Active Member of Clubs or Organizations: Not on file  . Attends Archivist Meetings: Not on file  . Marital Status: Not on file  Intimate Partner Violence:   . Fear of Current or Ex-Partner: Not on file  . Emotionally Abused: Not on file  . Physically Abused: Not on file  . Sexually Abused: Not on file    ROS  Objective   Vitals as reported by the patient: Today's Vitals   07/11/19 0919  Weight: 188 lb (85.3 kg)    [name] was seen today for follow-up.  Diagnoses and all orders for this visit:  Primary insomnia -  Will create treatment plan based on sleep study results   Weight gain -  IMPROVED with exercise    I discussed the assessment and treatment plan with the patient. The patient was provided an opportunity to ask questions and all were answered. The patient agreed with the plan and demonstrated an understanding of the instructions.   The patient was advised to call back or seek an in-person evaluation if the symptoms worsen or if the condition fails to improve as anticipated.  I provided 15 minutes of non-face-to-face time during this encounter.  Forrest Moron, MD  Primary Care at [facility]

## 2019-07-19 ENCOUNTER — Telehealth: Payer: Self-pay

## 2019-07-19 NOTE — Progress Notes (Signed)
07/19/2019 lvm

## 2019-07-19 NOTE — Telephone Encounter (Signed)
Pt returned call. Please call back when available. 

## 2019-07-19 NOTE — Telephone Encounter (Signed)
lvm asking for pt to call back.

## 2019-07-19 NOTE — Progress Notes (Signed)
Patient referred by Dr. Creta Levin, seen by me on 06/14/19, diagnostic PSG on 07/06/19.   Please call and notify the patient that the recent sleep study did not show any significant obstructive sleep apnea with the exception of mild, intermittent snoring and mild REM related OSA, during supine sleep only. Weight loss and avoiding the supine sleep position will likely eliminate her positional and sleep stage related mild OSA. Otherwise, she slept rather well, over 95% of the time tested and achieved all stages of sleep in nearly normal percentages. Overall, very benign results; she can follow up with her PCP.

## 2019-07-19 NOTE — Telephone Encounter (Signed)
I contacted the pt and left a vm asking her to call me back so we could review sleep study results.

## 2019-07-19 NOTE — Telephone Encounter (Signed)
I reached back out to the pt and advised of results. She verbalized understanding and was agreeable to following up with PCP.

## 2019-07-19 NOTE — Telephone Encounter (Signed)
Please call pt back when available.  °

## 2019-07-19 NOTE — Telephone Encounter (Signed)
-----   Message from Huston Foley, MD sent at 07/19/2019  8:38 AM EST ----- Patient referred by Dr. Creta Levin, seen by me on 06/14/19, diagnostic PSG on 07/06/19.   Please call and notify the patient that the recent sleep study did not show any significant obstructive sleep apnea with the exception of mild, intermittent snoring and mild REM related OSA, during supine sleep only. Weight loss and avoiding the supine sleep position will likely eliminate her positional and sleep stage related mild OSA. Otherwise, she slept rather well, over 95% of the time tested and achieved all stages of sleep in nearly normal percentages. Overall, very benign results; she can follow up with her PCP.

## 2019-07-19 NOTE — Procedures (Signed)
PATIENT'S NAME:  Mallory Martin, Mallory Martin DOB:      [birth year]      MR#:    [id number]     DATE OF RECORDING: 07/06/2019 REFERRING M.D.:  Delia Chimes, MD Study Performed:   Baseline Polysomnogram HISTORY: 44 year old woman with a history of asthma, allergies, anemia, migraine headaches, hypothyroidism, and obesity, who reports snoring and difficulty initiating and maintaining sleep for the past few months. The patient endorsed the Epworth Sleepiness Scale at 2 points. The patient's weight 190 pounds with a height of 62 (inches), resulting in a BMI of 34.9 kg/m2. The patient's neck circumference measured 14.8 inches.  CURRENT MEDICATIONS: Tylenol, Synthroid, Multivitamin, Vit D   PROCEDURE:  This is a multichannel digital polysomnogram utilizing the Somnostar 11.2 system.  Electrodes and sensors were applied and monitored per AASM Specifications.   EEG, EOG, Chin and Limb EMG, were sampled at 200 Hz.  ECG, Snore and Nasal Pressure, Thermal Airflow, Respiratory Effort, CPAP Flow and Pressure, Oximetry was sampled at 50 Hz. Digital video and audio were recorded.      BASELINE STUDY  Lights Out was at 21:45 and Lights On at 04:35.  Total recording time (TRT) was 410.5 minutes, with a total sleep time (TST) of 392.5 minutes.   The patient's sleep latency was 10.5 minutes.  REM latency was 80.5 minutes, which is normal. The sleep efficiency was 95.6 %.     SLEEP ARCHITECTURE: WASO (Wake after sleep onset) was 6.5 minutes.  There were 13 minutes in Stage N1, 235.5 minutes Stage N2, 66.5 minutes Stage N3 and 77.5 minutes in Stage REM.  The percentage of Stage N1 was 3.3%, Stage N2 was 60.%, which is mildly increased, Stage N3 was 16.9%, which is normal and Stage R (REM sleep) was 19.7%, which is near normal. The arousals were noted as: 23 were spontaneous, 0 were associated with PLMs, 3 were associated with respiratory events.  RESPIRATORY ANALYSIS:  There were a total of 17 respiratory events:  2 obstructive apneas,  0 central apneas and 0 mixed apneas with a total of 2 apneas and an apnea index (AI) of .3 /hour. There were 15 hypopneas with a hypopnea index of 2.3 /hour. The patient also had 0 respiratory event related arousals (RERAs).      The total APNEA/HYPOPNEA INDEX (AHI) was 2.6/hour and the total RESPIRATORY DISTURBANCE INDEX was  2.6 /hour.  16 events occurred in REM sleep and 2 events in NREM. The REM AHI was  12.4 /hour, versus a non-REM AHI of .2. The patient spent 102 minutes of total sleep time in the supine position and 291 minutes in non-supine.. The supine AHI was 10.0 versus a non-supine AHI of 0.0.  OXYGEN SATURATION & C02:  The Wake baseline 02 saturation was 98%, with the lowest being 90%. Time spent below 89% saturation equaled 0 minutes.  PERIODIC LIMB MOVEMENTS: The patient had a total of 0 Periodic Limb Movements.  The Periodic Limb Movement (PLM) index was 0 and the PLM Arousal index was 0/hour.  Audio and video analysis did not show any abnormal or unusual movements, behaviors, phonations or vocalizations. The patient took no bathroom breaks. Mild intermittent snoring was noted. The EKG was in keeping with normal sinus rhythm (NSR).  Post-study, the patient indicated that sleep was better than usual.   IMPRESSION:  1. Primary Snoring  RECOMMENDATIONS:  1. This study does not demonstrate any significant obstructive or central sleep disordered breathing with the exception of mild, intermittent snoring and mild  REM related OSA, during supine sleep only. Weight loss and avoiding the supine sleep position will likely eliminate her positional and sleep stage related mild OSA. This study does not support an intrinsic sleep disorder as a cause of the patient's symptoms. Other causes, including circadian rhythm disturbances, an underlying mood disorder, medication effect and/or an underlying medical problem cannot be ruled out. 2. This study shows no significant sleep fragmentation and  minimally abnormal sleep stage percentages.  3. The patient should be cautioned not to drive, work at heights, or operate dangerous or heavy equipment when tired or sleepy. Review and reiteration of good sleep hygiene measures should be pursued with any patient. 4. The patient will be advised to follow up with the referring provider, who will be notified of the test results.  I certify that I have reviewed the entire raw data recording prior to the issuance of this report in accordance with the Standards of Accreditation of the American Academy of Sleep Medicine (AASM)  Huston Foley, MD, PhD Diplomat, American Board of Neurology and Sleep Medicine (Neurology and Sleep Medicine)

## 2019-08-30 ENCOUNTER — Encounter (HOSPITAL_COMMUNITY): Payer: Self-pay | Admitting: Emergency Medicine

## 2019-08-30 ENCOUNTER — Emergency Department (HOSPITAL_COMMUNITY): Payer: BC Managed Care – PPO

## 2019-08-30 ENCOUNTER — Emergency Department (HOSPITAL_COMMUNITY)
Admission: EM | Admit: 2019-08-30 | Discharge: 2019-08-31 | Disposition: A | Discharge status: Home / Self Care | Payer: BC Managed Care – PPO | Attending: Emergency Medicine | Admitting: Emergency Medicine

## 2019-08-30 ENCOUNTER — Ambulatory Visit: Payer: Self-pay

## 2019-08-30 ENCOUNTER — Other Ambulatory Visit: Payer: Self-pay

## 2019-08-30 DIAGNOSIS — Z20822 Contact with and (suspected) exposure to covid-19: Secondary | ICD-10-CM | POA: Diagnosis not present

## 2019-08-30 DIAGNOSIS — J45909 Unspecified asthma, uncomplicated: Secondary | ICD-10-CM | POA: Insufficient documentation

## 2019-08-30 DIAGNOSIS — R0789 Other chest pain: Secondary | ICD-10-CM | POA: Insufficient documentation

## 2019-08-30 DIAGNOSIS — Z79899 Other long term (current) drug therapy: Secondary | ICD-10-CM | POA: Insufficient documentation

## 2019-08-30 DIAGNOSIS — R079 Chest pain, unspecified: Secondary | ICD-10-CM

## 2019-08-30 LAB — BASIC METABOLIC PANEL
Anion gap: 11 (ref 5–15)
BUN: 10 mg/dL (ref 6–20)
CO2: 24 mmol/L (ref 22–32)
Calcium: 9 mg/dL (ref 8.9–10.3)
Chloride: 102 mmol/L (ref 98–111)
Creatinine, Ser: 0.78 mg/dL (ref 0.44–1.00)
GFR calc Af Amer: >60 mL/min (ref >60)
GFR calc non Af Amer: >60 mL/min (ref >60)
Glucose, Bld: 94 mg/dL (ref 70–99)
Potassium: 4 mmol/L (ref 3.5–5.1)
Sodium: 137 mmol/L (ref 135–145)

## 2019-08-30 LAB — CBC
HCT: 38.7 % (ref 36.0–46.0)
Hemoglobin: 12.2 g/dL (ref 12.0–15.0)
MCH: 27.2 pg (ref 26.0–34.0)
MCHC: 31.5 g/dL (ref 30.0–36.0)
MCV: 86.2 fL (ref 80.0–100.0)
Platelets: 420 10*3/uL — ABNORMAL HIGH (ref 150–400)
RBC: 4.49 MIL/uL (ref 3.87–5.11)
RDW: 12.5 % (ref 11.5–15.5)
WBC: 8 10*3/uL (ref 4.0–10.5)
nRBC: 0 % (ref 0.0–0.2)

## 2019-08-30 LAB — TROPONIN I (HIGH SENSITIVITY)
Troponin I (High Sensitivity): <2 ng/L (ref <18)
Troponin I (High Sensitivity): <2 ng/L (ref <18)

## 2019-08-30 LAB — I-STAT BETA HCG BLOOD, ED (MC, WL, AP ONLY): I-stat hCG, quantitative: <5 m[IU]/mL (ref <5)

## 2019-08-30 LAB — D-DIMER, QUANTITATIVE: D-Dimer, Quant: 0.39 ug/mL-FEU (ref 0.00–0.50)

## 2019-08-30 MED ORDER — NAPROXEN 250 MG PO TABS
500.0000 mg | ORAL_TABLET | Freq: Once | ORAL | Status: AC
  Administered 2019-08-31: 500 mg via ORAL
  Filled 2019-08-30: qty 2

## 2019-08-30 NOTE — Discharge Instructions (Signed)
Your work-up today was overall reassuring. There were some mild EKG abnormalities that will need to be investigated further.  This can be done through a primary care provider or cardiology.  Antiinflammatory medications: Take 440 mg (over the counter dose) to 500 mg (prescription dose) of naproxen every 12 hours for the next 5 days. After this time, this medication may be used as needed for pain.  Take this medication with food to avoid upset stomach.  Acetaminophen (generic for Tylenol): Should you continue to have additional pain while taking the naproxen, you may add in acetaminophen as needed. Your daily total maximum amount of acetaminophen from all sources should be limited to 4000mg /day for persons without liver problems, or 2000mg /day for those with liver problems.

## 2019-08-30 NOTE — ED Provider Notes (Signed)
[facility] EMERGENCY DEPARTMENT Provider Note   CSN: [id number] Arrival date & time: 08/30/19  1309     History Chief Complaint  Patient presents with  . Chest Pain    Mallory Martin is a 44 y.o. female.  HPI  HPI: A 44 year old patient with a history of obesity presents for evaluation of chest pain. Initial onset of pain was less than one hour ago. The patient's chest pain is described as heaviness/pressure/tightness and is worse with exertion. The patient's chest pain is middle- or left-sided, is not well-localized, is not sharp and does not radiate to the arms/jaw/neck. The patient does not complain of nausea and denies diaphoresis. The patient has no history of stroke, has no history of peripheral artery disease, has not smoked in the past 90 days, denies any history of treated diabetes, has no relevant family history of coronary artery disease (first degree relative at less than age 19), is not hypertensive and has no history of hypercholesterolemia.    Mallory Martin is a 44 y.o. female, with a history of asthma and anemia, presenting to the ED with chest pain beginning March 27.  Beginning the day after receiving Covid vaccine. Pain is left sided, described as a pressure, worsening, was intermittent, but constant since yesterday.  Moderate to severe. Accompanied by worsening shortness of breath, which is worse with exertion and talking. Denies history of PE/DVT, recent trauma, recent surgery, recent immobilization/travel. Denies fever/chills, lower extremity edema/pain, headaches, N/V/D, cough, abdominal pain, or any other complaints.     Past Medical History:  Diagnosis Date  . Allergy   . Anemia   . Asthma   . Constipation   . Migraines    menstrual migraines  . Postpartum care following cesarean delivery (10/24) 03/17/2014    Patient Active Problem List   Diagnosis Date Noted  . Acute blood loss anemia 03/20/2014  . Umbilical hernia with  obstruction but no gangrene 12/18/2013    Past Surgical History:  Procedure Laterality Date  . CESAREAN SECTION  2002  . CESAREAN SECTION N/A 03/17/2014   Procedure: CESAREAN SECTION;  Surgeon: Claiborne Billings A. Pamala Hurry, MD;  Location: [facility];  Service: Obstetrics;  Laterality: N/A;  repeat c-section  . HERNIA REPAIR    . INSERTION OF MESH N/A 04/14/2016   Procedure: INSERTION OF MESH;  Surgeon: Fanny Skates, MD;  Location: [facility];  Service: General;  Laterality: N/A;  INSERTION OF MESH  . UMBILICAL HERNIA REPAIR N/A 04/14/2016   Procedure: OPEN REPAIR INCARCERATED UMBILICAL HERNIA WITH MESH;  Surgeon: Fanny Skates, MD;  Location: [facility];  Service: General;  Laterality: N/A;  OPEN REPAIR INCARCERATED UMBILICAL HERNIA WITH MESH  . WISDOM TOOTH EXTRACTION       OB History    Gravida  2   Para  2   Term  2   Preterm  0   AB  0   Living  2     SAB  0   TAB  0   Ectopic  0   Multiple  0   Live Births  1           Family History  Problem Relation Age of Onset  . Cancer Mother   . Heart disease Mother   . Mental illness Sister     Social History   Tobacco Use  . Smoking status: Never Smoker  . Smokeless tobacco: Never Used  Substance Use Topics  . Alcohol use:  No  . Drug use: No    Home Medications Prior to Admission medications   Medication Sig Start Date End Date Taking? Authorizing Provider  acetaminophen (TYLENOL) 500 MG tablet Take 500 mg by mouth every 6 (six) hours as needed for mild pain.    Yes [provider]  Ascorbic Acid (VITAMIN C PO) Take 1 tablet by mouth daily.    Yes [provider]  levothyroxine (SYNTHROID) 50 MCG tablet Take 50 mcg by mouth daily. 01/16/19  Yes [provider]  Multiple Vitamins-Minerals (MULTIVITAMIN WITH MINERALS) tablet Take 1 tablet by mouth daily.   Yes [provider]  VITAMIN D PO Take 1 tablet by mouth daily.    Yes [provider]      Allergies    Advil [ibuprofen], Excedrin extra strength [aspirin-acetaminophen-caffeine], Excedrin migraine  [asa-apap-caff buffered], Oxycodone, and Vicodin [hydrocodone-acetaminophen]  Review of Systems   Review of Systems  Constitutional: Negative for chills, diaphoresis and fever.  Respiratory: Positive for shortness of breath. Negative for cough.   Cardiovascular: Positive for chest pain. Negative for leg swelling.  Gastrointestinal: Negative for abdominal pain, diarrhea, nausea and vomiting.  Musculoskeletal: Negative for back pain.  Neurological: Negative for dizziness, syncope and weakness.  All other systems reviewed and are negative.   Physical Exam Updated Vital Signs BP 108/63 (BP Location: Right Arm)   Pulse 70   Temp 98.3 F (36.8 C) (Oral)   Resp 14   Ht 5\' 2"  (1.575 m)   Wt 86.2 kg   LMP 07/31/2019   SpO2 100%   BMI 34.75 kg/m   Physical Exam Vitals and nursing note reviewed.  Constitutional:      General: She is not in acute distress.    Appearance: She is well-developed. She is not diaphoretic.  HENT:     Head: Normocephalic and atraumatic.     Mouth/Throat:     Mouth: Mucous membranes are moist.     Pharynx: Oropharynx is clear.  Eyes:     Conjunctiva/sclera: Conjunctivae normal.  Cardiovascular:     Rate and Rhythm: Normal rate and regular rhythm.     Pulses: Normal pulses.          Radial pulses are 2+ on the right side and 2+ on the left side.       Posterior tibial pulses are 2+ on the right side and 2+ on the left side.     Heart sounds: Normal heart sounds.     Comments: Tactile temperature in the extremities appropriate and equal bilaterally. Pulmonary:     Effort: Pulmonary effort is normal. No respiratory distress.     Breath sounds: Normal breath sounds.  Chest:     Chest wall: No tenderness.  Abdominal:     Palpations: Abdomen is soft.     Tenderness: There is no abdominal tenderness. There is no guarding.  Musculoskeletal:      Cervical back: Neck supple.     Right lower leg: No edema.     Left lower leg: No edema.     Comments: No lower extremity edema, erythema, increased warmth, or tenderness.  Lymphadenopathy:     Cervical: No cervical adenopathy.  Skin:    General: Skin is warm and dry.  Neurological:     Mental Status: She is alert.  Psychiatric:        Mood and Affect: Mood and affect normal.        Speech: Speech normal.  Behavior: Behavior normal.     ED Results / Procedures / Treatments   Labs (all labs ordered are listed, but only abnormal results are displayed) Labs Reviewed  CBC - Abnormal; Notable for the following components:      Result Value   Platelets 420 (*)    All other components within normal limits  SARS CORONAVIRUS 2 (TAT 6-24 HRS)  BASIC METABOLIC PANEL  D-DIMER, QUANTITATIVE (NOT AT [facility])  I-STAT BETA HCG BLOOD, ED ([facility], [facility], [facility] ONLY)  TROPONIN I (HIGH SENSITIVITY)  TROPONIN I (HIGH SENSITIVITY)    EKG EKG Interpretation  Date/Time:  Wednesday August 30 2019 13:12:59 EDT Ventricular Rate:  70 PR Interval:  156 QRS Duration: 80 QT Interval:  396 QTC Calculation: 427 R Axis:   85 Text Interpretation: Normal sinus rhythm Nonspecific T wave abnormality Abnormal ECG mild ST elevation in V2 without contiguous elevations or reciprocal changes Confirmed by Marily Memos [phone]) on 08/30/2019 11:46:26 PM   Radiology DG Chest 2 View  Result Date: 08/30/2019 CLINICAL DATA:  Left-sided chest pain. History of asthma. EXAM: CHEST - 2 VIEW COMPARISON:  None. FINDINGS: The cardiomediastinal contours are normal. The lungs are clear. Pulmonary vasculature is normal. No consolidation, pleural effusion, or pneumothorax. No acute osseous abnormalities are seen. IMPRESSION: Negative radiographs of the chest. Electronically Signed   By: Narda Rutherford M.D.   On: 08/30/2019 13:58    Procedures Procedures (including critical care time)  Medications Ordered in ED Medications   naproxen (NAPROSYN) tablet 500 mg (has no administration in time range)    ED Course  I have reviewed the triage vital signs and the nursing notes.  Pertinent labs & imaging results that were available during my care of the patient were reviewed by me and considered in my medical decision making (see chart for details).    MDM Rules/Calculators/A&P HEAR Score: 3                     Patient presents with complaint of chest pain. Patient is nontoxic appearing, afebrile, not tachycardic, not tachypneic, not hypotensive, maintains excellent SPO2 on room air, and is in no apparent distress.   I have reviewed the patient's chart to obtain more information.  I reviewed and interpreted the patient's labs and radiological studies.  Low suspicion for ACS. No high risk/suspicious features; no exertional chest pain, vomiting, diaphoresis, or radiation. EKG without definitive evidence of acute ischemia or pathologic/symptomatic arrhythmia.  Nonspecific T wave abnormalities.  We will advise patient to follow-up with cardiology on this matter. Wells criteria score is 0, indicating low risk for PE.   Dissection was considered, but thought less likely base on: History and description of the pain are not suggestive, patient is not ill-appearing, lack of risk factors, equal bilateral pulses, lack of neurologic deficits, no widened mediastinum on chest x-ray.  Despite the patient's low Wells score, her story is mildly concerning enough to investigate for PE further.  She is still low risk for PE.   End of shift patient care handoff report given to Sharilyn Sites, PA-C. Plan: D-dimer pending.  If negative, patient to be discharged with NSAID management.  Vitals:   08/30/19 2241 08/30/19 2244 08/30/19 2245 08/30/19 2300  BP: 128/66  114/72 106/73  Pulse:    71  Resp: 18  15 14   Temp:      TempSrc:      SpO2:  100%  100%  Weight:      Height:  Final Clinical Impression(s) / ED Diagnoses Final  diagnoses:  None    Rx / DC Orders ED Discharge Orders    None       Concepcion Living 08/30/19 2350    Gerhard Munch, MD 09/02/19 1300

## 2019-08-30 NOTE — ED Triage Notes (Signed)
Patient arrives c/o chest pain onset of March 28th. States she was unable to lay on her left side due to pain. Pain lasted until that Tuesday. Now pain has returned last night. Describes it as a tightness in middle of chest. Pain worse when standing or talking.

## 2019-08-30 NOTE — Telephone Encounter (Signed)
Called patient to follow up and she was already at the ED receiving care.

## 2019-08-30 NOTE — Telephone Encounter (Signed)
Pt. Reports she had her second COVID 19 vaccine 08/18/19. That following Sunday started having chest pain and tightness. That lasted 3 days. Last night she started having chest pain again. Hurts in the middle does not radiate, no dizziness or nausea. Has intermittent shortness of breath. Chest hurts now. Pain 5/10. Instructed to go to ED now. Verbalizes understanding. Instructed not to drive.  Reason for Disposition . [1] Chest pain (or "angina") comes and goes AND [2] is happening more often (increasing in frequency) or getting worse (increasing in severity)  Answer Assessment - Initial Assessment Questions 1. LOCATION: "Where does it hurt?"       Hurts in the middle 2. RADIATION: "Does the pain go anywhere else?" (e.g., into neck, jaw, arms, back)     No 3. ONSET: "When did the chest pain begin?" (Minutes, hours or days)      Last week 4. PATTERN "Does the pain come and go, or has it been constant since it started?"  "Does it get worse with exertion?"      Lasted all night 5. DURATION: "How long does it last" (e.g., seconds, minutes, hours)     Hours 6. SEVERITY: "How bad is the pain?"  (e.g., Scale 1-10; mild, moderate, or severe)    - MILD (1-3): doesn't interfere with normal activities     - MODERATE (4-7): interferes with normal activities or awakens from sleep    - SEVERE (8-10): excruciating pain, unable to do any normal activities       Feels tight - 5 7. CARDIAC RISK FACTORS: "Do you have any history of heart problems or risk factors for heart disease?" (e.g., angina, prior heart attack; diabetes, high blood pressure, high cholesterol, smoker, or strong family history of heart disease)     No 8. PULMONARY RISK FACTORS: "Do you have any history of lung disease?"  (e.g., blood clots in lung, asthma, emphysema, birth control pills)     Asthma as a child 9. CAUSE: "What do you think is causing the chest pain?"     Unsure 10. OTHER SYMPTOMS: "Do you have any other symptoms?" (e.g.,  dizziness, nausea, vomiting, sweating, fever, difficulty breathing, cough)       Periods of shortness of breath 11. PREGNANCY: "Is there any chance you are pregnant?" "When was your last menstrual period?"       No  Protocols used: CHEST PAIN-A-AH

## 2019-08-31 LAB — SARS CORONAVIRUS 2 (TAT 6-24 HRS): SARS Coronavirus 2: NEGATIVE

## 2019-09-01 ENCOUNTER — Encounter: Payer: Self-pay | Admitting: Cardiology

## 2019-09-01 ENCOUNTER — Ambulatory Visit (INDEPENDENT_AMBULATORY_CARE_PROVIDER_SITE_OTHER): Payer: BC Managed Care – PPO | Admitting: Cardiology

## 2019-09-01 ENCOUNTER — Other Ambulatory Visit: Payer: Self-pay

## 2019-09-01 DIAGNOSIS — R0789 Other chest pain: Secondary | ICD-10-CM

## 2019-09-01 DIAGNOSIS — R079 Chest pain, unspecified: Secondary | ICD-10-CM | POA: Diagnosis not present

## 2019-09-01 DIAGNOSIS — R011 Cardiac murmur, unspecified: Secondary | ICD-10-CM

## 2019-09-01 HISTORY — DX: Cardiac murmur, unspecified: R01.1

## 2019-09-01 HISTORY — DX: Other chest pain: R07.89

## 2019-09-01 NOTE — Patient Instructions (Signed)
Medication Instructions:  No medication changes *If you need a refill on your cardiac medications before your next appointment, please call your pharmacy*   Lab Work: You need to have a pregnancy test prior to having your lexiscan.You can come Monday through Friday 8:30 am to 12:00 pm and 1:15 to 4:30. You do not need to make an appointment as the order has already been placed.    If you have labs (blood work) drawn today and your tests are completely normal, you will receive your results only by: [name] MyChart Message (if you have MyChart) OR . A paper copy in the mail If you have any lab test that is abnormal or we need to change your treatment, we will call you to review the results.   Testing/Procedures: Your physician has requested that you have an echocardiogram. Echocardiography is a painless test that uses sound waves to create images of your heart. It provides your doctor with information about the size and shape of your heart and how well your heart's chambers and valves are working. This procedure takes approximately one hour. There are no restrictions for this procedure.  Your physician has requested that you have a lexiscan myoview. For further information please visit [email or web address] Please follow instruction sheet, as given.  The test will take approximately 3 to 4 hours to complete; you may bring reading material.  If someone comes with you to your appointment, they will need to remain in the main lobby due to limited space in the testing area. **If you are pregnant or breastfeeding, please notify the nuclear lab prior to your appointment**  How to prepare for your Myocardial Perfusion Test: . Do not eat or drink 3 hours prior to your test, except you may have water. . Do not consume products containing caffeine (regular or decaffeinated) 12 hours prior to your test. (ex: coffee, chocolate, sodas, tea). . Do bring a list of your current medications with you.  If not listed  below, you may take your medications as normal. . Do wear comfortable clothes (no dresses or overalls) and walking shoes, tennis shoes preferred (No heels or open toe shoes are allowed). . Do NOT wear cologne, perfume, aftershave, or lotions (deodorant is allowed). . If these instructions are not followed, your test will have to be rescheduled.    Follow-Up: At [facility], you and your health needs are our priority.  As part of our continuing mission to provide you with exceptional heart care, we have created designated Provider Care Teams.  These Care Teams include your primary Cardiologist (physician) and Advanced Practice Providers (APPs -  Physician Assistants and Nurse Practitioners) who all work together to provide you with the care you need, when you need it.  We recommend signing up for the patient portal called "MyChart".  Sign up information is provided on this After Visit Summary.  MyChart is used to connect with patients for Virtual Visits (Telemedicine).  Patients are able to view lab/test results, encounter notes, upcoming appointments, etc.  Non-urgent messages can be sent to your provider as well.   To learn more about what you can do with MyChart, go to [email or web address].    Your next appointment:   3 month(s)  The format for your next appointment:   In Person  Provider:   Belva Crome, MD   Other Instructions NA

## 2019-09-01 NOTE — Progress Notes (Signed)
Cardiology Office Note:    Date:  09/01/2019   ID:  Mallory Martin, DOB [birth year], MRN [id number]  PCP:  Forrest Moron, MD  Cardiologist:  Jenean Lindau, MD   Referring MD: Forrest Moron, MD    ASSESSMENT:    1. Chest discomfort   2. Cardiac murmur    PLAN:    In order of problems listed above:  1. Primary prevention stressed with the patient.  Importance of compliance with diet medication stressed and she vocalized understanding.  Her blood pressure stable.  Importance of weight reduction stressed and risks of obesity explained and she promises to comply. 2. Chest discomfort: Her symptoms are typical for coronary etiology however she is very concerned about it and in view of this we will do a Lexiscan sestamibi.  We will make sure she gets evaluated with a pregnancy test before the nuclear testing.  I discussed this with her and she understood and agreeable 3. Cardiac murmur: Echocardiogram will be done to assess murmur on auscultation 4. Patient will be seen in follow-up appointment in 3 months or earlier if the patient has any concerns    Medication Adjustments/Labs and Tests Ordered: Current medicines are reviewed at length with the patient today.  Concerns regarding medicines are outlined above.  No orders of the defined types were placed in this encounter.  No orders of the defined types were placed in this encounter.    History of Present Illness:    Mallory Martin is a 44 y.o. female who is being seen today for the evaluation of chest discomfort at the request of Forrest Moron, MD.  Patient is a past medical history not much significant.  She denies history of hypertension dyslipidemia or diabetes mellitus.  Overall she leads a sedentary lifestyle.  She had pressure-like sensation in the chest.  No radiation to the neck or to the arms.  For this reason she went to the emergency room and was treated and released.  Subsequently she has done well.  She leads a  sedentary life without much exercise.  At the time of my evaluation, the patient is alert awake oriented and in no distress.  No orthopnea or PND.  Past Medical History:  Diagnosis Date  . Allergy   . Anemia   . Asthma   . Constipation   . Migraines    menstrual migraines  . Postpartum care following cesarean delivery (10/24) 03/17/2014    Past Surgical History:  Procedure Laterality Date  . CESAREAN SECTION  2002  . CESAREAN SECTION N/A 03/17/2014   Procedure: CESAREAN SECTION;  Surgeon: Claiborne Billings A. Pamala Hurry, MD;  Location: [facility];  Service: Obstetrics;  Laterality: N/A;  repeat c-section  . HERNIA REPAIR    . INSERTION OF MESH N/A 04/14/2016   Procedure: INSERTION OF MESH;  Surgeon: Fanny Skates, MD;  Location: [place];  Service: General;  Laterality: N/A;  INSERTION OF MESH  . UMBILICAL HERNIA REPAIR N/A 04/14/2016   Procedure: OPEN REPAIR INCARCERATED UMBILICAL HERNIA WITH MESH;  Surgeon: Fanny Skates, MD;  Location: [place];  Service: General;  Laterality: N/A;  OPEN REPAIR INCARCERATED UMBILICAL HERNIA WITH MESH  . WISDOM TOOTH EXTRACTION      Current Medications: Current Meds  Medication Sig  . acetaminophen (TYLENOL) 500 MG tablet Take 500 mg by mouth every 6 (six) hours as needed for mild pain.   . Ascorbic Acid (VITAMIN C PO) Take 1 tablet by mouth  daily.   . levothyroxine (SYNTHROID) 50 MCG tablet Take 50 mcg by mouth daily.  [name] MELATONIN PO Take by mouth.  . Multiple Vitamins-Minerals (MULTIVITAMIN WITH MINERALS) tablet Take 1 tablet by mouth daily.  [name] VITAMIN D PO Take 1 tablet by mouth daily.      Allergies:   Advil [ibuprofen], Excedrin extra strength [aspirin-acetaminophen-caffeine], Excedrin migraine  [asa-apap-caff buffered], Oxycodone, and Vicodin [hydrocodone-acetaminophen]   Social History   Socioeconomic History  . Marital status: Married    Spouse name: Not on file  . Number of children: Not on file  . Years of  education: Not on file  . Highest education level: Not on file  Occupational History  . Not on file  Tobacco Use  . Smoking status: Never Smoker  . Smokeless tobacco: Never Used  Substance and Sexual Activity  . Alcohol use: No  . Drug use: No  . Sexual activity: Yes    Birth control/protection: Condom  Other Topics Concern  . Not on file  Social History Narrative  . Not on file   Social Determinants of Health   Financial Resource Strain:   . Difficulty of Paying Living Expenses:   Food Insecurity:   . Worried About Programme researcher, broadcasting/film/video in the Last Year:   . Barista in the Last Year:   Transportation Needs:   . Freight forwarder (Medical):   [name] Lack of Transportation (Non-Medical):   Physical Activity:   . Days of Exercise per Week:   . Minutes of Exercise per Session:   Stress:   . Feeling of Stress :   Social Connections:   . Frequency of Communication with Friends and Family:   . Frequency of Social Gatherings with Friends and Family:   . Attends Religious Services:   . Active Member of Clubs or Organizations:   . Attends Banker Meetings:   [name] Marital Status:      Family History: The patient's family history includes Cancer in her mother; Heart disease in her mother; Mental illness in her sister.  ROS:   Please see the history of present illness.    All other systems reviewed and are negative.  EKGs/Labs/Other Studies Reviewed:    The following studies were reviewed today: EKG done in the emergency room revealed sinus rhythm and nonspecific ST-T changes   Recent Labs: 06/07/2019: TSH 2.890 08/30/2019: BUN 10; Creatinine, Ser 0.78; Hemoglobin 12.2; Platelets 420; Potassium 4.0; Sodium 137  Recent Lipid Panel    Component Value Date/Time   CHOL 191 06/07/2019 1609   TRIG 66 06/07/2019 1609   HDL 68 06/07/2019 1609   CHOLHDL 2.8 06/07/2019 1609   LDLCALC 111 (H) 06/07/2019 1609    Physical Exam:    VS:  BP 114/72   Pulse 60    Ht 5\' 2"  (1.575 m)   Wt 195 lb (88.5 kg)   SpO2 100%   BMI 35.67 kg/m     Wt Readings from Last 3 Encounters:  09/01/19 195 lb (88.5 kg)  08/30/19 190 lb (86.2 kg)  07/11/19 188 lb (85.3 kg)     GEN: Patient is in no acute distress HEENT: Normal NECK: No JVD; No carotid bruits LYMPHATICS: No lymphadenopathy CARDIAC: S1 S2 regular, 2/6 systolic murmur at the apex. RESPIRATORY:  Clear to auscultation without rales, wheezing or rhonchi  ABDOMEN: Soft, non-tender, non-distended MUSCULOSKELETAL:  No edema; No deformity  SKIN: Warm and dry NEUROLOGIC:  Alert and oriented x 3 PSYCHIATRIC:  Normal affect    Signed, Garwin Brothers, MD  09/01/2019 3:17 PM    [facility]

## 2019-09-11 ENCOUNTER — Ambulatory Visit (INDEPENDENT_AMBULATORY_CARE_PROVIDER_SITE_OTHER): Payer: BC Managed Care – PPO | Admitting: Family Medicine

## 2019-09-11 ENCOUNTER — Other Ambulatory Visit: Payer: Self-pay

## 2019-09-11 ENCOUNTER — Encounter: Payer: Self-pay | Admitting: Family Medicine

## 2019-09-11 VITALS — BP 110/56 | HR 75 | Temp 98.0°F | Ht 62.0 in | Wt 192.0 lb

## 2019-09-11 DIAGNOSIS — Z6835 Body mass index (BMI) 35.0-35.9, adult: Secondary | ICD-10-CM

## 2019-09-11 DIAGNOSIS — R079 Chest pain, unspecified: Secondary | ICD-10-CM | POA: Diagnosis not present

## 2019-09-11 DIAGNOSIS — E6609 Other obesity due to excess calories: Secondary | ICD-10-CM

## 2019-09-11 NOTE — Patient Instructions (Addendum)
  Power  Foods  -  Avocado and olives  -  Spinach and lemon juice -  All the fruits ending in "berry"   Continue the mediterranean diet.    If you have lab work done today you will be contacted with your lab results within the next 2 weeks.  If you have not heard from [place] then please contact us. The fastest way to get your results is to register for My Chart.   IF you received an x-ray today, you will receive an invoice from [facility]. Please contact [facility] at [phone] with questions or concerns regarding your invoice.   IF you received labwork today, you will receive an invoice from [facility]. Please contact [facility] at [phone] with questions or concerns regarding your invoice.   Our billing staff will not be able to assist you with questions regarding bills from these companies.  You will be contacted with the lab results as soon as they are available. The fastest way to get your results is to activate your My Chart account. Instructions are located on the last page of this paperwork. If you have not heard from [place] regarding the results in 2 weeks, please contact this office.

## 2019-09-11 NOTE — Progress Notes (Signed)
Established Patient Office Visit  Subjective:  Patient ID: Mallory Martin, female    DOB: 1975/12/05  Age: 44 y.o. MRN: [id number]  CC:  Chief Complaint  Patient presents with  . Hospitalization Follow-up    from chest pain. on monday she had slight SHOB. Today she feels fine     HPI Mallory Martin presents for   Seen in the ER on 08/30/19 - she was discharged home after heart pain ACS ruled out She saw Cardiology on 09/01/19 and was set up for a LEXISCAN She states that the chest pain resolved She reports that she is having a mediterranean diet She denies palpitations, chest pains, roaring sound in her ears or pulsations  Wt Readings from Last 3 Encounters:  09/11/19 192 lb (87.1 kg)  09/01/19 195 lb (88.5 kg)  08/30/19 190 lb (86.2 kg)    The 10-year ASCVD risk score Mikey Bussing DC Jr., et al., 2013) is: 0.2%   Values used to calculate the score:     Age: 67 years     Sex: Female     Is Non-Hispanic African American: Yes     Diabetic: No     Tobacco smoker: No     Systolic Blood Pressure: 235 mmHg     Is BP treated: No     HDL Cholesterol: 68 mg/dL     Total Cholesterol: 191 mg/dL  Past Medical History:  Diagnosis Date  . Allergy   . Anemia   . Asthma   . Constipation   . Migraines    menstrual migraines  . Postpartum care following cesarean delivery (10/24) 03/17/2014    Past Surgical History:  Procedure Laterality Date  . CESAREAN SECTION  2002  . CESAREAN SECTION N/A 03/17/2014   Procedure: CESAREAN SECTION;  Surgeon: Claiborne Billings A. Pamala Hurry, MD;  Location: [facility];  Service: Obstetrics;  Laterality: N/A;  repeat c-section  . HERNIA REPAIR    . INSERTION OF MESH N/A 04/14/2016   Procedure: INSERTION OF MESH;  Surgeon: Fanny Skates, MD;  Location: [place];  Service: General;  Laterality: N/A;  INSERTION OF MESH  . UMBILICAL HERNIA REPAIR N/A 04/14/2016   Procedure: OPEN REPAIR INCARCERATED UMBILICAL HERNIA WITH MESH;  Surgeon: Fanny Skates, MD;   Location: [place];  Service: General;  Laterality: N/A;  OPEN REPAIR INCARCERATED UMBILICAL HERNIA WITH MESH  . WISDOM TOOTH EXTRACTION      Family History  Problem Relation Age of Onset  . Cancer Mother   . Heart disease Mother   . Mental illness Sister     Social History   Socioeconomic History  . Marital status: Married    Spouse name: Not on file  . Number of children: Not on file  . Years of education: Not on file  . Highest education level: Not on file  Occupational History  . Not on file  Tobacco Use  . Smoking status: Never Smoker  . Smokeless tobacco: Never Used  Substance and Sexual Activity  . Alcohol use: No  . Drug use: No  . Sexual activity: Yes    Birth control/protection: Condom  Other Topics Concern  . Not on file  Social History Narrative  . Not on file   Social Determinants of Health   Financial Resource Strain:   . Difficulty of Paying Living Expenses:   Food Insecurity:   . Worried About Charity fundraiser in the Last Year:   . [employer]  in the Last Year:   Transportation Needs:   . Freight forwarder (Medical):   [name] Lack of Transportation (Non-Medical):   Physical Activity:   . Days of Exercise per Week:   . Minutes of Exercise per Session:   Stress:   . Feeling of Stress :   Social Connections:   . Frequency of Communication with Friends and Family:   . Frequency of Social Gatherings with Friends and Family:   . Attends Religious Services:   . Active Member of Clubs or Organizations:   . Attends Banker Meetings:   [name] Marital Status:   Intimate Partner Violence:   . Fear of Current or Ex-Partner:   . Emotionally Abused:   [name] Physically Abused:   . Sexually Abused:     Outpatient Medications Prior to Visit  Medication Sig Dispense Refill  . acetaminophen (TYLENOL) 500 MG tablet Take 500 mg by mouth every 6 (six) hours as needed for mild pain.     . Ascorbic Acid (VITAMIN C PO) Take 1 tablet  by mouth daily.     [name] levothyroxine (SYNTHROID) 50 MCG tablet Take 50 mcg by mouth daily.    [name] MELATONIN PO Take by mouth.    . Multiple Vitamins-Minerals (MULTIVITAMIN WITH MINERALS) tablet Take 1 tablet by mouth daily.    [name] VITAMIN D PO Take 1 tablet by mouth daily.      No facility-administered medications prior to visit.    Allergies  Allergen Reactions  . Advil [Ibuprofen] Swelling    08/30/19: States she can take naproxen  . Excedrin Extra Strength [Aspirin-Acetaminophen-Caffeine] Swelling    Excedrin Migraine  . Excedrin Migraine  [Asa-Apap-Caff Buffered] Swelling  . Oxycodone Nausea Only  . Vicodin [Hydrocodone-Acetaminophen] Nausea Only    ROS Review of Systems Review of Systems  Constitutional: Negative for activity change, appetite change, chills and fever.  HENT: Negative for congestion, nosebleeds, trouble swallowing and voice change.   Respiratory: Negative for cough, shortness of breath and wheezing.   Gastrointestinal: Negative for diarrhea, nausea and vomiting.  Genitourinary: Negative for difficulty urinating, dysuria, flank pain and hematuria.  Musculoskeletal: Negative for back pain, joint swelling and neck pain.  Neurological: Negative for dizziness, speech difficulty, light-headedness and numbness.  See HPI. All other review of systems negative.     Objective:    Physical Exam  BP (!) 110/56   Pulse 75   Temp 98 F (36.7 C) (Temporal)   Ht 5\' 2"  (1.575 m)   Wt 192 lb (87.1 kg)   LMP 09/02/2019   SpO2 100%   BMI 35.12 kg/m  Wt Readings from Last 3 Encounters:  09/11/19 192 lb (87.1 kg)  09/01/19 195 lb (88.5 kg)  08/30/19 190 lb (86.2 kg)    Physical Exam  Constitutional: Oriented to person, place, and time. Appears well-developed and well-nourished.  HENT:  Head: Normocephalic and atraumatic.  Eyes: Conjunctivae and EOM are normal.  Cardiovascular: Normal rate, regular rhythm, normal heart sounds and intact distal pulses.  No murmur  heard. Pulmonary/Chest: Effort normal and breath sounds normal. No stridor. No respiratory distress. Has no wheezes.  Neurological: Is alert and oriented to person, place, and time.  Skin: Skin is warm. Capillary refill takes less than 2 seconds.  Psychiatric: Has a normal mood and affect. Behavior is normal. Judgment and thought content normal.    Health Maintenance Due  Topic Date Due  . COVID-19 Vaccine (1) Never done  . PAP SMEAR-Modifier  Never done  There are no preventive care reminders to display for this patient.  Lab Results  Component Value Date   TSH 2.890 06/07/2019   Lab Results  Component Value Date   WBC 8.0 08/30/2019   HGB 12.2 08/30/2019   HCT 38.7 08/30/2019   MCV 86.2 08/30/2019   PLT 420 (H) 08/30/2019   Lab Results  Component Value Date   NA 137 08/30/2019   K 4.0 08/30/2019   CO2 24 08/30/2019   GLUCOSE 94 08/30/2019   BUN 10 08/30/2019   CREATININE 0.78 08/30/2019   CALCIUM 9.0 08/30/2019   ANIONGAP 11 08/30/2019   Lab Results  Component Value Date   CHOL 191 06/07/2019   Lab Results  Component Value Date   HDL 68 06/07/2019   Lab Results  Component Value Date   LDLCALC 111 (H) 06/07/2019   Lab Results  Component Value Date   TRIG 66 06/07/2019   Lab Results  Component Value Date   CHOLHDL 2.8 06/07/2019   Lab Results  Component Value Date   HGBA1C 5.4 06/07/2019      Assessment & Plan:   Problem List Items Addressed This Visit    None    Visit Diagnoses    Chest pain, unspecified type    -  Primary   Class 2 obesity due to excess calories without serious comorbidity with body mass index (BMI) of 35.0 to 35.9 in adult         Continue lifestyle modification Advised pt to continue to increase exercise Reviewed labs with patient No need for repeat labs at this time   No orders of the defined types were placed in this encounter.   Follow-up: No follow-ups on file.    Doristine Bosworth, MD

## 2019-09-14 ENCOUNTER — Other Ambulatory Visit: Payer: Self-pay | Admitting: *Deleted

## 2019-09-14 ENCOUNTER — Other Ambulatory Visit: Payer: Self-pay | Admitting: Cardiology

## 2019-09-14 DIAGNOSIS — R079 Chest pain, unspecified: Secondary | ICD-10-CM

## 2019-09-14 DIAGNOSIS — R0789 Other chest pain: Secondary | ICD-10-CM

## 2019-09-15 LAB — PREGNANCY, URINE: Preg Test, Ur: NEGATIVE

## 2019-09-19 ENCOUNTER — Other Ambulatory Visit (HOSPITAL_COMMUNITY): Payer: Self-pay | Admitting: Cardiology

## 2019-09-19 DIAGNOSIS — R011 Cardiac murmur, unspecified: Secondary | ICD-10-CM

## 2019-09-20 ENCOUNTER — Telehealth (HOSPITAL_COMMUNITY): Payer: Self-pay

## 2019-09-20 NOTE — Telephone Encounter (Signed)
Detailed instructions left for the patient on her answering machine per her DPR. Asked to call back with any questions. S.Skylur Fuston EMTP

## 2019-09-21 ENCOUNTER — Other Ambulatory Visit: Payer: Self-pay

## 2019-09-21 ENCOUNTER — Ambulatory Visit (HOSPITAL_BASED_OUTPATIENT_CLINIC_OR_DEPARTMENT_OTHER): Payer: BC Managed Care – PPO

## 2019-09-21 ENCOUNTER — Ambulatory Visit (HOSPITAL_COMMUNITY): Payer: BC Managed Care – PPO | Attending: Cardiology

## 2019-09-21 VITALS — Ht 62.0 in | Wt 192.0 lb

## 2019-09-21 DIAGNOSIS — R011 Cardiac murmur, unspecified: Secondary | ICD-10-CM

## 2019-09-21 DIAGNOSIS — R0789 Other chest pain: Secondary | ICD-10-CM | POA: Diagnosis present

## 2019-09-21 DIAGNOSIS — R079 Chest pain, unspecified: Secondary | ICD-10-CM | POA: Diagnosis present

## 2019-09-21 MED ORDER — TECHNETIUM TC 99M TETROFOSMIN IV KIT
10.9000 | PACK | Freq: Once | INTRAVENOUS | Status: AC | PRN
  Administered 2019-09-21: 10.9 via INTRAVENOUS
  Filled 2019-09-21: qty 11

## 2019-09-21 MED ORDER — PERFLUTREN LIPID MICROSPHERE
1.0000 mL | INTRAVENOUS | Status: AC | PRN
  Administered 2019-09-21: 1 mL via INTRAVENOUS

## 2019-09-21 MED ORDER — REGADENOSON 0.4 MG/5ML IV SOLN
0.4000 mg | Freq: Once | INTRAVENOUS | Status: AC
  Administered 2019-09-21: 0.4 mg via INTRAVENOUS

## 2019-09-21 MED ORDER — TECHNETIUM TC 99M TETROFOSMIN IV KIT
31.7000 | PACK | Freq: Once | INTRAVENOUS | Status: AC | PRN
  Administered 2019-09-21: 31.7 via INTRAVENOUS
  Filled 2019-09-21: qty 32

## 2019-09-22 LAB — MYOCARDIAL PERFUSION IMAGING
LV dias vol: 85 mL (ref 46–106)
LV sys vol: 26 mL
Peak HR: 114 {beats}/min
Rest HR: 63 {beats}/min
SDS: 0
SRS: 0
SSS: 0
TID: 1.02

## 2019-10-02 ENCOUNTER — Encounter: Payer: Self-pay | Admitting: Family Medicine

## 2019-10-02 ENCOUNTER — Other Ambulatory Visit: Payer: Self-pay

## 2019-10-02 ENCOUNTER — Ambulatory Visit: Payer: BC Managed Care – PPO | Admitting: Family Medicine

## 2019-10-02 VITALS — BP 103/64 | HR 83 | Temp 97.9°F | Ht 62.0 in | Wt 192.2 lb

## 2019-10-02 DIAGNOSIS — R079 Chest pain, unspecified: Secondary | ICD-10-CM

## 2019-10-02 DIAGNOSIS — R1030 Lower abdominal pain, unspecified: Secondary | ICD-10-CM

## 2019-10-02 NOTE — Progress Notes (Incomplete)
Established Patient Office Visit  Subjective:  Patient ID: Mallory Martin, female    DOB: 03/18/1976  Age: 44 y.o. MRN: [id number]  CC:  Chief Complaint  Patient presents with  . Hospitalization Follow-up    was seen at the ER on 08/30/19 with chest pain. Seen by Cardiology on 09/01/19    HPI Mallory Martin presents for   Patient states that she   Past Medical History:  Diagnosis Date  . Allergy   . Anemia   . Asthma   . Constipation   . Migraines    menstrual migraines  . Postpartum care following cesarean delivery (10/24) 03/17/2014    Past Surgical History:  Procedure Laterality Date  . CESAREAN SECTION  2002  . CESAREAN SECTION N/A 03/17/2014   Procedure: CESAREAN SECTION;  Surgeon: Claiborne Billings A. Pamala Hurry, MD;  Location: [facility];  Service: Obstetrics;  Laterality: N/A;  repeat c-section  . HERNIA REPAIR    . INSERTION OF MESH N/A 04/14/2016   Procedure: INSERTION OF MESH;  Surgeon: Fanny Skates, MD;  Location: [place];  Service: General;  Laterality: N/A;  INSERTION OF MESH  . UMBILICAL HERNIA REPAIR N/A 04/14/2016   Procedure: OPEN REPAIR INCARCERATED UMBILICAL HERNIA WITH MESH;  Surgeon: Fanny Skates, MD;  Location: [place];  Service: General;  Laterality: N/A;  OPEN REPAIR INCARCERATED UMBILICAL HERNIA WITH MESH  . WISDOM TOOTH EXTRACTION      Family History  Problem Relation Age of Onset  . Cancer Mother   . Heart disease Mother   . Mental illness Sister     Social History   Socioeconomic History  . Marital status: Married    Spouse name: Not on file  . Number of children: Not on file  . Years of education: Not on file  . Highest education level: Not on file  Occupational History  . Not on file  Tobacco Use  . Smoking status: Never Smoker  . Smokeless tobacco: Never Used  Substance and Sexual Activity  . Alcohol use: No  . Drug use: No  . Sexual activity: Yes    Birth control/protection: Condom  Other Topics  Concern  . Not on file  Social History Narrative  . Not on file   Social Determinants of Health   Financial Resource Strain:   . Difficulty of Paying Living Expenses:   Food Insecurity:   . Worried About Charity fundraiser in the Last Year:   . Arboriculturist in the Last Year:   Transportation Needs:   . Film/video editor (Medical):   [name] Lack of Transportation (Non-Medical):   Physical Activity:   . Days of Exercise per Week:   . Minutes of Exercise per Session:   Stress:   . Feeling of Stress :   Social Connections:   . Frequency of Communication with Friends and Family:   . Frequency of Social Gatherings with Friends and Family:   . Attends Religious Services:   . Active Member of Clubs or Organizations:   . Attends Archivist Meetings:   [name] Marital Status:   Intimate Partner Violence:   . Fear of Current or Ex-Partner:   . Emotionally Abused:   [name] Physically Abused:   . Sexually Abused:     Outpatient Medications Prior to Visit  Medication Sig Dispense Refill  . acetaminophen (TYLENOL) 500 MG tablet Take 500 mg by mouth every 6 (six) hours as needed for mild pain.     [name]  Ascorbic Acid (VITAMIN C PO) Take 1 tablet by mouth daily.     [name] levothyroxine (SYNTHROID) 50 MCG tablet Take 50 mcg by mouth daily.    [name] MELATONIN PO Take by mouth.    . Multiple Vitamins-Minerals (MULTIVITAMIN WITH MINERALS) tablet Take 1 tablet by mouth daily.    [name] VITAMIN D PO Take 1 tablet by mouth daily.      No facility-administered medications prior to visit.    Allergies  Allergen Reactions  . Advil [Ibuprofen] Swelling    08/30/19: States she can take naproxen  . Excedrin Extra Strength [Aspirin-Acetaminophen-Caffeine] Swelling    Excedrin Migraine  . Excedrin Migraine  [Asa-Apap-Caff Buffered] Swelling  . Oxycodone Nausea Only  . Vicodin [Hydrocodone-Acetaminophen] Nausea Only    ROS Review of Systems    Objective:    Physical Exam  BP 103/64 (BP Location:  Right Arm, Patient Position: Sitting, Cuff Size: Large)   Pulse 83   Temp 97.9 F (36.6 C) (Temporal)   Ht 5\' 2"  (1.575 m)   Wt 192 lb 3.2 oz (87.2 kg)   LMP 09/02/2019   SpO2 100%   BMI 35.15 kg/m  Wt Readings from Last 3 Encounters:  10/02/19 192 lb 3.2 oz (87.2 kg)  09/21/19 192 lb (87.1 kg)  09/11/19 192 lb (87.1 kg)     There are no preventive care reminders to display for this patient.  There are no preventive care reminders to display for this patient.  Lab Results  Component Value Date   TSH 2.890 06/07/2019   Lab Results  Component Value Date   WBC 8.0 08/30/2019   HGB 12.2 08/30/2019   HCT 38.7 08/30/2019   MCV 86.2 08/30/2019   PLT 420 (H) 08/30/2019   Lab Results  Component Value Date   NA 137 08/30/2019   K 4.0 08/30/2019   CO2 24 08/30/2019   GLUCOSE 94 08/30/2019   BUN 10 08/30/2019   CREATININE 0.78 08/30/2019   CALCIUM 9.0 08/30/2019   ANIONGAP 11 08/30/2019   Lab Results  Component Value Date   CHOL 191 06/07/2019   Lab Results  Component Value Date   HDL 68 06/07/2019   Lab Results  Component Value Date   LDLCALC 111 (H) 06/07/2019   Lab Results  Component Value Date   TRIG 66 06/07/2019   Lab Results  Component Value Date   CHOLHDL 2.8 06/07/2019   Lab Results  Component Value Date   HGBA1C 5.4 06/07/2019      Assessment & Plan:   Problem List Items Addressed This Visit    None      No orders of the defined types were placed in this encounter.   Follow-up: No follow-ups on file.    06/09/2019, MD

## 2019-10-02 NOTE — Patient Instructions (Signed)
° ° ° °  If you have lab work done today you will be contacted with your lab results within the next 2 weeks.  If you have not heard from us then please contact us. The fastest way to get your results is to register for My Chart. ° ° °IF you received an x-ray today, you will receive an invoice from [facility]. Please contact [facility] at [phone] with questions or concerns regarding your invoice.  ° °IF you received labwork today, you will receive an invoice from [facility]. Please contact [facility] at [phone] with questions or concerns regarding your invoice.  ° °Our billing staff will not be able to assist you with questions regarding bills from these companies. ° °You will be contacted with the lab results as soon as they are available. The fastest way to get your results is to activate your My Chart account. Instructions are located on the last page of this paperwork. If you have not heard from us regarding the results in 2 weeks, please contact this office. °  ° ° ° °

## 2019-10-03 LAB — POCT CBC
Granulocyte percent: 73.5 %G (ref 37–80)
HCT, POC: 36.6 % (ref 29–41)
Hemoglobin: 11.7 g/dL (ref 11–14.6)
Lymph, poc: 1.8 (ref 0.6–3.4)
MCH, POC: 27.1 pg (ref 27–31.2)
MCHC: 32 g/dL (ref 31.8–35.4)
MCV: 84.7 fL (ref 76–111)
MID (cbc): 0.4 (ref 0–0.9)
MPV: 6.8 fL (ref 0–99.8)
POC Granulocyte: 6 (ref 2–6.9)
POC LYMPH PERCENT: 22.2 %L (ref 10–50)
POC MID %: 4.3 %M (ref 0–12)
Platelet Count, POC: 395 10*3/uL (ref 142–424)
RBC: 4.32 M/uL (ref 4.04–5.48)
RDW, POC: 12.9 %
WBC: 8.2 10*3/uL (ref 4.6–10.2)

## 2019-11-24 ENCOUNTER — Ambulatory Visit (INDEPENDENT_AMBULATORY_CARE_PROVIDER_SITE_OTHER): Payer: BC Managed Care – PPO | Admitting: Cardiology

## 2019-11-24 ENCOUNTER — Encounter: Payer: Self-pay | Admitting: Cardiology

## 2019-11-24 ENCOUNTER — Other Ambulatory Visit: Payer: Self-pay

## 2019-11-24 VITALS — BP 100/74 | HR 66 | Ht 62.0 in | Wt 194.0 lb

## 2019-11-24 DIAGNOSIS — R011 Cardiac murmur, unspecified: Secondary | ICD-10-CM | POA: Diagnosis not present

## 2019-11-24 DIAGNOSIS — R0789 Other chest pain: Secondary | ICD-10-CM

## 2019-11-24 NOTE — Patient Instructions (Signed)
Medication Instructions:  No medication changes *If you need a refill on your cardiac medications before your next appointment, please call your pharmacy*   Lab Work: None ordered If you have labs (blood work) drawn today and your tests are completely normal, you will receive your results only by: . MyChart Message (if you have MyChart) OR . A paper copy in the mail If you have any lab test that is abnormal or we need to change your treatment, we will call you to review the results.   Testing/Procedures: None ordered   Follow-Up: At [facility], you and your health needs are our priority.  As part of our continuing mission to provide you with exceptional heart care, we have created designated Provider Care Teams.  These Care Teams include your primary Cardiologist (physician) and Advanced Practice Providers (APPs -  Physician Assistants and Nurse Practitioners) who all work together to provide you with the care you need, when you need it.  We recommend signing up for the patient portal called "MyChart".  Sign up information is provided on this After Visit Summary.  MyChart is used to connect with patients for Virtual Visits (Telemedicine).  Patients are able to view lab/test results, encounter notes, upcoming appointments, etc.  Non-urgent messages can be sent to your provider as well.   To learn more about what you can do with MyChart, go to [email or web address].    Your next appointment:   9 month(s)  The format for your next appointment:   In Person  Provider:   Rajan Revankar, MD   Other Instructions NA 

## 2019-11-24 NOTE — Progress Notes (Signed)
Cardiology Office Note:    Date:  11/24/2019   ID:  Mallory Martin, DOB Apr 30, 1976, MRN [id number]  PCP:  Doristine Bosworth, MD  Cardiologist:  Garwin Brothers, MD   Referring MD: Doristine Bosworth, MD    ASSESSMENT:    1. Cardiac murmur   2. Chest discomfort    PLAN:    In order of problems listed above:  1. Primary prevention stressed with the patient.  Importance of compliance with diet medication stressed and she vocalized understanding. 2. Cardiac murmur: Echocardiogram was unremarkable. 3. Chest discomfort: Atypical stress test unremarkable and her blood pressure stable.  I told her to exercise at least half an hour a day 5 days a week and she promises to do so. 4. Patient will be seen in follow-up appointment in 9 months or earlier if the patient has any concerns    Medication Adjustments/Labs and Tests Ordered: Current medicines are reviewed at length with the patient today.  Concerns regarding medicines are outlined above.  No orders of the defined types were placed in this encounter.  No orders of the defined types were placed in this encounter.    Chief Complaint  Patient presents with   Follow-up     History of Present Illness:    Mallory Martin is a 44 y.o. female.  Patient has past medical history of chest discomfort and was evaluated.  Subsequently she is done well and is very active.  She walks on a regular basis.  No chest pain orthopnea or PND.  Her stress test has been unremarkable.  At the time of my evaluation, the patient is alert awake oriented and in no distress.  Past Medical History:  Diagnosis Date   Allergy    Anemia    Asthma    Constipation    Migraines    menstrual migraines   Postpartum care following cesarean delivery (10/24) 03/17/2014    Past Surgical History:  Procedure Laterality Date   CESAREAN SECTION  2002   CESAREAN SECTION N/A 03/17/2014   Procedure: CESAREAN SECTION;  Surgeon: Tresa Endo A. Ernestina Penna, MD;  Location:  [facility];  Service: Obstetrics;  Laterality: N/A;  repeat c-section   HERNIA REPAIR     INSERTION OF MESH N/A 04/14/2016   Procedure: INSERTION OF MESH;  Surgeon: Claud Kelp, MD;  Location: [facility];  Service: General;  Laterality: N/A;  INSERTION OF MESH   UMBILICAL HERNIA REPAIR N/A 04/14/2016   Procedure: OPEN REPAIR INCARCERATED UMBILICAL HERNIA WITH MESH;  Surgeon: Claud Kelp, MD;  Location: [facility];  Service: General;  Laterality: N/A;  OPEN REPAIR INCARCERATED UMBILICAL HERNIA WITH MESH   WISDOM TOOTH EXTRACTION      Current Medications: Current Meds  Medication Sig   acetaminophen (TYLENOL) 500 MG tablet Take 500 mg by mouth every 6 (six) hours as needed for mild pain.    levothyroxine (SYNTHROID) 50 MCG tablet Take 50 mcg by mouth daily.   Multiple Vitamins-Minerals (MULTIVITAMIN WITH MINERALS) tablet Take 1 tablet by mouth daily.     Allergies:   Advil [ibuprofen], Excedrin extra strength [aspirin-acetaminophen-caffeine], Excedrin migraine  [asa-apap-caff buffered], Oxycodone, and Vicodin [hydrocodone-acetaminophen]   Social History   Socioeconomic History   Marital status: Married    Spouse name: Not on file   Number of children: Not on file   Years of education: Not on file   Highest education level: Not on file  Occupational History   Not on file  Tobacco Use   Smoking status: Never Smoker   Smokeless tobacco: Never Used  Vaping Use   Vaping Use: Never used  Substance and Sexual Activity   Alcohol use: No   Drug use: No   Sexual activity: Yes    Birth control/protection: Condom  Other Topics Concern   Not on file  Social History Narrative   Not on file   Social Determinants of Health   Financial Resource Strain:    Difficulty of Paying Living Expenses:   Food Insecurity:    Worried About Programme researcher, broadcasting/film/video in the Last Year:    Barista in the Last Year:   Transportation Needs:     Freight forwarder (Medical):    Lack of Transportation (Non-Medical):   Physical Activity:    Days of Exercise per Week:    Minutes of Exercise per Session:   Stress:    Feeling of Stress :   Social Connections:    Frequency of Communication with Friends and Family:    Frequency of Social Gatherings with Friends and Family:    Attends Religious Services:    Active Member of Clubs or Organizations:    Attends Engineer, structural:    Marital Status:      Family History: The patient's family history includes Cancer in her mother; Heart disease in her mother; Mental illness in her sister.  ROS:   Please see the history of present illness.    All other systems reviewed and are negative.  EKGs/Labs/Other Studies Reviewed:    The following studies were reviewed today: Study Highlights    Nuclear stress EF: 70%.  The left ventricular ejection fraction is hyperdynamic (>65%).  There was no ST segment deviation noted during stress.  The study is normal.  This is a low risk study.   Normal pharmacologic nuclear stress test with no evidence for prior infarct or ischemia. Hyperdynamic LVEF.    Recent Labs: 06/07/2019: TSH 2.890 08/30/2019: BUN 10; Creatinine, Ser 0.78; Platelets 420; Potassium 4.0; Sodium 137 10/03/2019: Hemoglobin 11.7  Recent Lipid Panel    Component Value Date/Time   CHOL 191 06/07/2019 1609   TRIG 66 06/07/2019 1609   HDL 68 06/07/2019 1609   CHOLHDL 2.8 06/07/2019 1609   LDLCALC 111 (H) 06/07/2019 1609    Physical Exam:    VS:  BP 100/74    Pulse 66    Ht 5\' 2"  (1.575 m)    Wt 194 lb (88 kg)    SpO2 99%    BMI 35.48 kg/m     Wt Readings from Last 3 Encounters:  11/24/19 194 lb (88 kg)  10/02/19 192 lb 3.2 oz (87.2 kg)  09/21/19 192 lb (87.1 kg)     GEN: Patient is in no acute distress HEENT: Normal NECK: No JVD; No carotid bruits LYMPHATICS: No lymphadenopathy CARDIAC: Hear sounds regular, 2/6 systolic murmur at  the apex. RESPIRATORY:  Clear to auscultation without rales, wheezing or rhonchi  ABDOMEN: Soft, non-tender, non-distended MUSCULOSKELETAL:  No edema; No deformity  SKIN: Warm and dry NEUROLOGIC:  Alert and oriented x 3 PSYCHIATRIC:  Normal affect   Signed, 09/23/19, MD  11/24/2019 10:10 AM    [facility]

## 2020-06-03 IMAGING — US ULTRASOUND RIGHT BREAST LIMITED
1 series · 5 of 5 positions shown · non-contrast
Comparison: Previous exam(s).

CLINICAL DATA: Short-term interval follow-up of a probable benign
fibroadenoma in the right breast.

EXAM:
DIGITAL DIAGNOSTIC BILATERAL MAMMOGRAM WITH CAD AND TOMO
ULTRASOUND RIGHT BREAST

[Series 1: ultrasound right breast limited · 0.06mm/px · 5 of 5 slices shown]
[im 1/5]
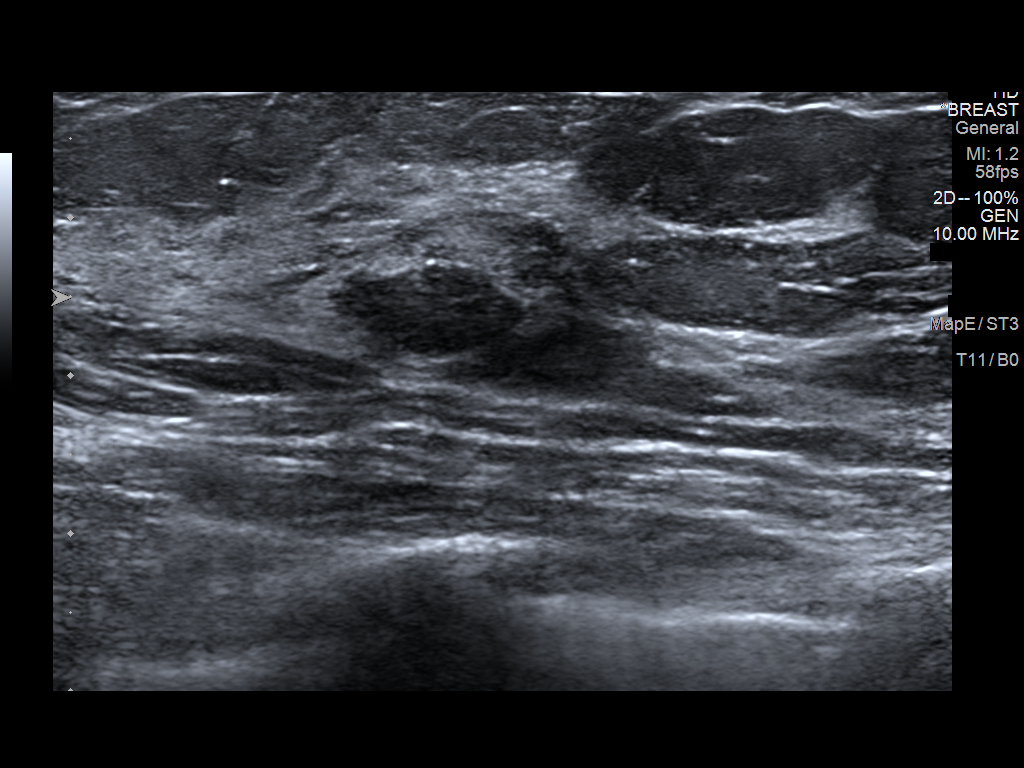
[im 2/5]
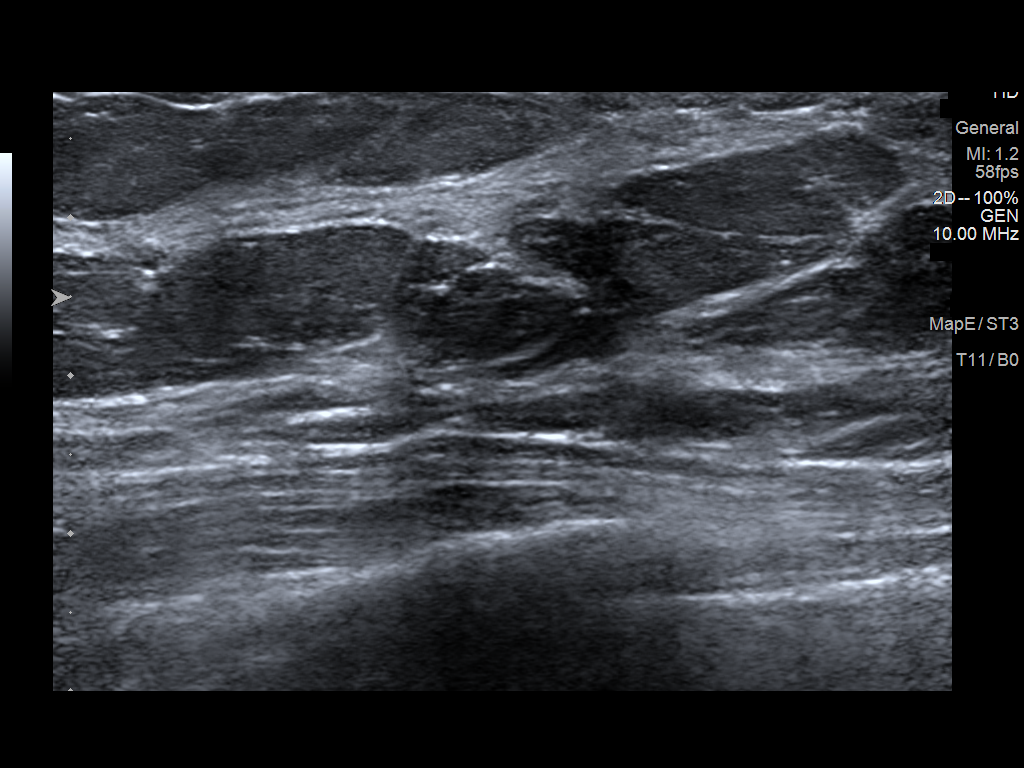
[im 3/5]
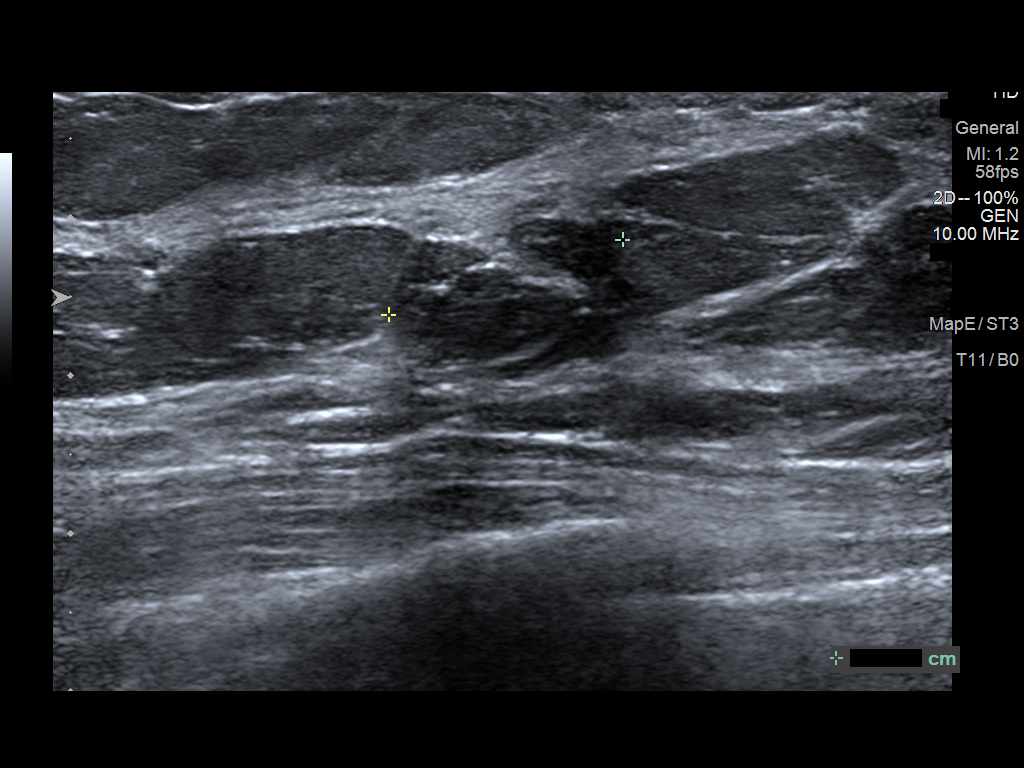
[im 4/5]
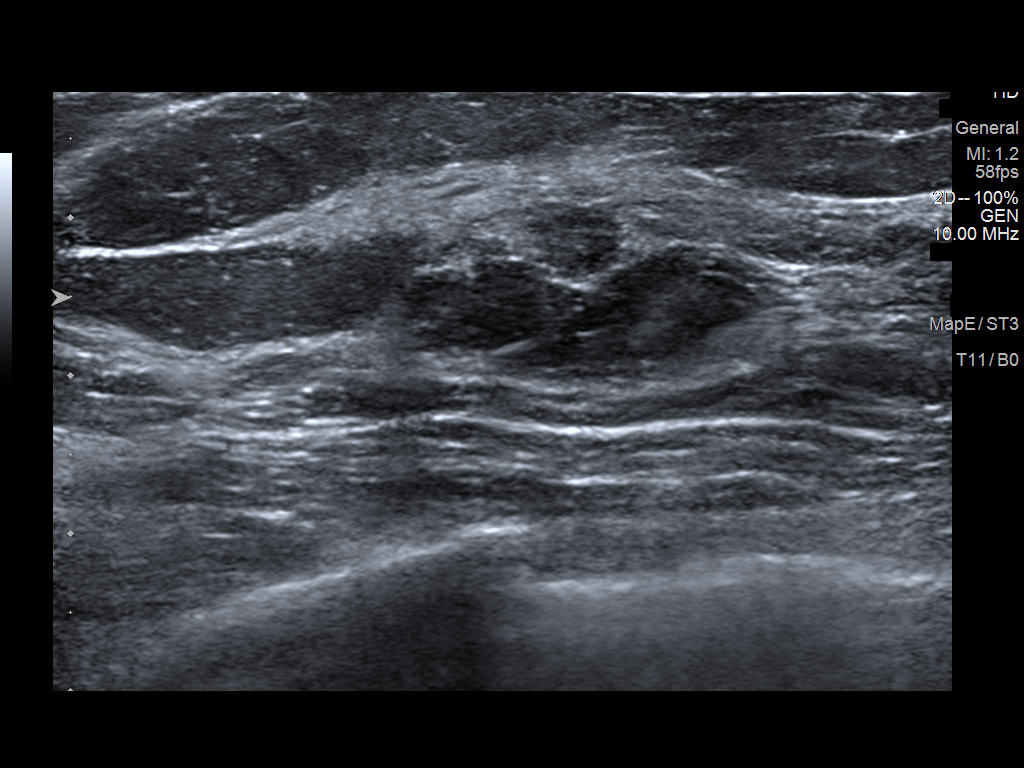
[im 5/5]
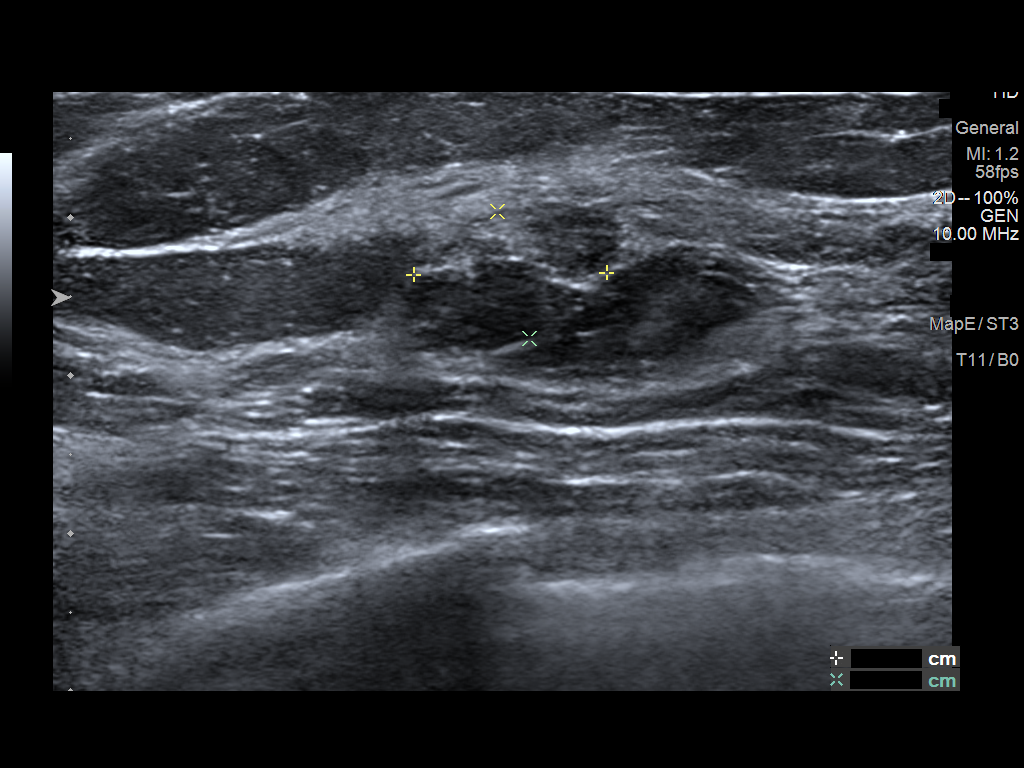

[5 of 5 positions shown; findings below may reference images not displayed]

ACR Breast Density Category c: The breast tissue is heterogeneously
dense, which may obscure small masses.
FINDINGS: There is a stable 1.9 cm circumscribed mass in the retroareolar
region of the right breast. No suspicious mass or malignant type
microcalcifications identified in either breast.

Mammographic images were processed with CAD.

Targeted ultrasound is performed, showing there is a
well-circumscribed predominantly hypoechoic mass in the right breast
11 o'clock 1 cm from the nipple measuring 1.2 x 0.8 x 1.6 cm. On the
prior ultrasound dated 10/27/2017 it measured 1.5 x 0.9 x 1.6 cm. It
is likely a benign fibroadenoma.
IMPRESSION: Stable probable benign mass in the right breast.

RECOMMENDATION:
Short-term interval follow-up bilateral diagnostic mammogram and
right breast ultrasound in 1 year is recommended to complete a 2
year follow-up.

I have discussed the findings and recommendations with the patient.
Results were also provided in writing at the conclusion of the
visit. If applicable, a reminder letter will be sent to the patient
regarding the next appointment.

BI-RADS CATEGORY  3: Probably benign.

## 2020-07-31 ENCOUNTER — Other Ambulatory Visit: Payer: Self-pay | Admitting: Family Medicine

## 2020-07-31 DIAGNOSIS — N63 Unspecified lump in unspecified breast: Secondary | ICD-10-CM

## 2020-08-26 ENCOUNTER — Other Ambulatory Visit: Payer: Self-pay

## 2020-08-26 DIAGNOSIS — G43909 Migraine, unspecified, not intractable, without status migrainosus: Secondary | ICD-10-CM | POA: Insufficient documentation

## 2020-08-26 DIAGNOSIS — K5900 Constipation, unspecified: Secondary | ICD-10-CM | POA: Insufficient documentation

## 2020-08-26 DIAGNOSIS — T7840XA Allergy, unspecified, initial encounter: Secondary | ICD-10-CM | POA: Insufficient documentation

## 2020-08-26 DIAGNOSIS — D649 Anemia, unspecified: Secondary | ICD-10-CM | POA: Insufficient documentation

## 2020-08-27 ENCOUNTER — Ambulatory Visit: Payer: BC Managed Care – PPO | Admitting: Cardiology

## 2020-09-12 ENCOUNTER — Emergency Department (HOSPITAL_BASED_OUTPATIENT_CLINIC_OR_DEPARTMENT_OTHER)
Admission: EM | Admit: 2020-09-12 | Discharge: 2020-09-12 | Disposition: A | Discharge status: Home / Self Care | Payer: BC Managed Care – PPO | Attending: Emergency Medicine | Admitting: Emergency Medicine

## 2020-09-12 ENCOUNTER — Encounter (HOSPITAL_BASED_OUTPATIENT_CLINIC_OR_DEPARTMENT_OTHER): Payer: Self-pay | Admitting: Emergency Medicine

## 2020-09-12 ENCOUNTER — Other Ambulatory Visit: Payer: Self-pay

## 2020-09-12 ENCOUNTER — Ambulatory Visit (INDEPENDENT_AMBULATORY_CARE_PROVIDER_SITE_OTHER): Payer: BC Managed Care – PPO | Admitting: Podiatry

## 2020-09-12 ENCOUNTER — Emergency Department (HOSPITAL_BASED_OUTPATIENT_CLINIC_OR_DEPARTMENT_OTHER): Payer: BC Managed Care – PPO

## 2020-09-12 ENCOUNTER — Ambulatory Visit (INDEPENDENT_AMBULATORY_CARE_PROVIDER_SITE_OTHER): Payer: BC Managed Care – PPO

## 2020-09-12 ENCOUNTER — Other Ambulatory Visit: Payer: BC Managed Care – PPO

## 2020-09-12 DIAGNOSIS — M778 Other enthesopathies, not elsewhere classified: Secondary | ICD-10-CM

## 2020-09-12 DIAGNOSIS — J45909 Unspecified asthma, uncomplicated: Secondary | ICD-10-CM | POA: Insufficient documentation

## 2020-09-12 DIAGNOSIS — R079 Chest pain, unspecified: Secondary | ICD-10-CM

## 2020-09-12 DIAGNOSIS — R0781 Pleurodynia: Secondary | ICD-10-CM | POA: Diagnosis not present

## 2020-09-12 LAB — D-DIMER, QUANTITATIVE: D-Dimer, Quant: <0.27 ug/mL-FEU (ref 0.00–0.50)

## 2020-09-12 LAB — BASIC METABOLIC PANEL
Anion gap: 7 (ref 5–15)
BUN: 14 mg/dL (ref 6–20)
CO2: 27 mmol/L (ref 22–32)
Calcium: 9 mg/dL (ref 8.9–10.3)
Chloride: 105 mmol/L (ref 98–111)
Creatinine, Ser: 1.06 mg/dL — ABNORMAL HIGH (ref 0.44–1.00)
GFR, Estimated: >60 mL/min (ref >60)
Glucose, Bld: 86 mg/dL (ref 70–99)
Potassium: 3.6 mmol/L (ref 3.5–5.1)
Sodium: 139 mmol/L (ref 135–145)

## 2020-09-12 LAB — CBC
HCT: 34.6 % — ABNORMAL LOW (ref 36.0–46.0)
Hemoglobin: 11.3 g/dL — ABNORMAL LOW (ref 12.0–15.0)
MCH: 27.8 pg (ref 26.0–34.0)
MCHC: 32.7 g/dL (ref 30.0–36.0)
MCV: 85 fL (ref 80.0–100.0)
Platelets: 375 10*3/uL (ref 150–400)
RBC: 4.07 MIL/uL (ref 3.87–5.11)
RDW: 13.2 % (ref 11.5–15.5)
WBC: 7.9 10*3/uL (ref 4.0–10.5)
nRBC: 0 % (ref 0.0–0.2)

## 2020-09-12 LAB — CBG MONITORING, ED: Glucose-Capillary: 103 mg/dL — ABNORMAL HIGH (ref 70–99)

## 2020-09-12 LAB — TROPONIN I (HIGH SENSITIVITY)
Troponin I (High Sensitivity): <2 ng/L (ref <18)
Troponin I (High Sensitivity): <2 ng/L (ref <18)

## 2020-09-12 LAB — PREGNANCY, URINE: Preg Test, Ur: NEGATIVE

## 2020-09-12 MED ORDER — ACETAMINOPHEN 325 MG PO TABS
650.0000 mg | ORAL_TABLET | Freq: Once | ORAL | Status: AC
  Administered 2020-09-12: 650 mg via ORAL
  Filled 2020-09-12: qty 2

## 2020-09-12 MED ORDER — TRIAMCINOLONE ACETONIDE 40 MG/ML IJ SUSP
20.0000 mg | Freq: Once | INTRAMUSCULAR | Status: AC
  Administered 2020-09-12: 20 mg

## 2020-09-12 MED ORDER — CYCLOBENZAPRINE HCL 10 MG PO TABS: 10.0000 mg | ORAL_TABLET | Freq: Two times a day (BID) | ORAL | 0 refills | Status: AC | PRN

## 2020-09-12 MED ORDER — METHYLPREDNISOLONE 4 MG PO TBPK: ORAL_TABLET | ORAL | 0 refills | Status: DC

## 2020-09-12 NOTE — Discharge Instructions (Signed)
Your work-up today was overall reassuring.  Your heart enzymes were negative both times we checked them and the blood clot test was negative.  Your chest x-ray is reassuring and we suspect is more chest wall discomfort causing your symptoms today.  Please use the muscle relaxant to help with muscle pains.  Please rest and stay hydrated.  If any symptoms change or worsen, please return to the nearest emergency department.  Please follow-up with your primary doctor.

## 2020-09-12 NOTE — ED Provider Notes (Signed)
[facility] EMERGENCY DEPT Provider Note   CSN: [id number] Arrival date & time: 09/12/20  1422     History Chief Complaint  Patient presents with  . Chest Pain    Mallory Martin is a 45 y.o. female.  HPI  HPI: A 45 year old patient presents for evaluation of chest pain. Initial onset of pain was approximately 1-3 hours ago. The patient's chest pain is sharp and is not worse with exertion. The patient's chest pain is not middle- or left-sided, is not well-localized, is not described as heaviness/pressure/tightness and does not radiate to the arms/jaw/neck. The patient does not complain of nausea and denies diaphoresis. The patient has no history of stroke, has no history of peripheral artery disease, has not smoked in the past 90 days, denies any history of treated diabetes, has no relevant family history of coronary artery disease (first degree relative at less than age 3), is not hypertensive, has no history of hypercholesterolemia and does not have an elevated BMI (>=30). Patient presents to the ED for evaluation of chest pain.  Patient states she started having symptoms last evening.  She felt pain in her right upper chest into her right shoulder.  The pain increased with deep breathing.  It also increases with any movements or palpation.  Patient states eventually she was able to go to sleep and this morning she felt well until about an hour or 2 prior to arrival she started having pain again in that same area.  Pain is similar and that again it increases with movement as well as palpation and occasionally deep breathing.  She denies any fevers or chills.  No coughing.  She denies any leg swelling.  Patient does not have any history of PE DVT or heart disease  Past Medical History:  Diagnosis Date  . Abnormal first trimester screen 11/16/2013  . Acute blood loss anemia 03/20/2014  . Allergic rhinitis 08/29/2007   Formatting of this note might be different from the original.  Allergic Rhinitis - Dr. Barnetta Chapel  10/1 IMO update  . Allergy   . Anemia   . Asthma   . Asthma 08/24/2007   Formatting of this note might be different from the original. Asthma - Dr. Barnetta Chapel  ICD-10 cut over  . Cardiac murmur 09/01/2019  . Chest discomfort 09/01/2019  . Constipation   . Dysmenorrhea 11/11/2007   Formatting of this note might be different from the original. Dysmenorrhea  . Encounter for preconception consultation 04/19/2013   Last Assessment & Plan:  Formatting of this note might be different from the original. Advised starting prenatal vitamins now, avoiding potential teratogens, and intercourse every 2-3 days regardless of timing.  Refilled OCP x 1 month.  Given patient's age, she will return for further eval of unable to become pregnant in 6 months  . 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 Cold sores since age 89  . Migraines    menstrual migraines  . Postpartum care following cesarean delivery (10/24) 03/17/2014  . Skin tag 04/19/2013   Last Assessment & Plan:  Formatting of this note might be different from the original. Irritated, painful, treated by cryotherapy today.  Discussed aftercare and red flag for return  . TMJ (temporomandibular joint disorder) 04/19/2013   Last Assessment & Plan:  Formatting of this note might be different from the original. Patient has taken naprosyn with no side effects and good relief in the past.  Will rx today.  Gave info handout on lifestyle changes to help with pain.  [name] Umbilical hernia with obstruction but no gangrene 12/18/2013    Patient Active Problem List   Diagnosis Date Noted  . Allergy   . Anemia   . Constipation   . Migraines   . Chest discomfort 09/01/2019  . Cardiac murmur 09/01/2019  . Acute blood loss anemia 03/20/2014  . Postpartum care following cesarean delivery (10/24) 03/17/2014  . Umbilical hernia with obstruction but no gangrene 12/18/2013  . Abnormal first  trimester screen 11/16/2013  . Encounter for preconception consultation 04/19/2013  . Skin tag 04/19/2013  . TMJ (temporomandibular joint disorder) 04/19/2013  . 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 Dysmenorrhea 11/11/2007  . Allergic rhinitis 08/29/2007  . Asthma 08/24/2007    Past Surgical History:  Procedure Laterality Date  . CESAREAN SECTION  2002  . CESAREAN SECTION N/A 03/17/2014   Procedure: CESAREAN SECTION;  Surgeon: Tresa Endo A. Ernestina Penna, MD;  Location: [facility];  Service: Obstetrics;  Laterality: N/A;  repeat c-section  . HERNIA REPAIR    . INSERTION OF MESH N/A 04/14/2016   Procedure: INSERTION OF MESH;  Surgeon: Claud Kelp, MD;  Location: [facility];  Service: General;  Laterality: N/A;  INSERTION OF MESH  . UMBILICAL HERNIA REPAIR N/A 04/14/2016   Procedure: OPEN REPAIR INCARCERATED UMBILICAL HERNIA WITH MESH;  Surgeon: Claud Kelp, MD;  Location: [facility];  Service: General;  Laterality: N/A;  OPEN REPAIR INCARCERATED UMBILICAL HERNIA WITH MESH  . WISDOM TOOTH EXTRACTION       OB History    Gravida  2   Para  2   Term  2   Preterm  0   AB  0   Living  2     SAB  0   IAB  0   Ectopic  0   Multiple  0   Live Births  1           Family History  Problem Relation Age of Onset  . Cancer Mother   . Heart disease Mother   . Mental illness Sister     Social History   Tobacco Use  . Smoking status: Never Smoker  . Smokeless tobacco: Never Used  Vaping Use  . Vaping Use: Never used  Substance Use Topics  . Alcohol use: No  . Drug use: No    Home Medications Prior to Admission medications   Medication Sig Start Date End Date Taking? Authorizing Provider  acetaminophen (TYLENOL) 500 MG tablet Take 500 mg by mouth every 6 (six) hours as needed for mild pain.     [provider]  albuterol (VENTOLIN HFA) 108 (90 Base) MCG/ACT inhaler Inhale 2 puffs into the lungs every 6 (six) hours as needed for  wheezing. 07/04/20   [provider]  azithromycin (ZITHROMAX) 250 MG tablet Take by mouth as directed. 07/29/20   [provider]  benzonatate (TESSALON) 100 MG capsule Take 100 mg by mouth 3 (three) times daily as needed. 07/23/20   [provider]  levothyroxine (SYNTHROID) 50 MCG tablet Take 50 mcg by mouth daily. 01/16/19   [provider]  LINZESS 145 MCG CAPS capsule Take 145 mcg by mouth daily. 08/23/20   [provider]  methylPREDNISolone (MEDROL DOSEPAK) 4 MG TBPK tablet 6 day dose pack - take as directed 09/12/20   Hyatt, Max T, DPM  Multiple Vitamins-Minerals (MULTIVITAMIN WITH MINERALS) tablet Take 1 tablet by mouth daily.  [provider]  Vitamin D, Ergocalciferol, (DRISDOL) 1.25 MG (50000 UNIT) CAPS capsule Take 1 capsule by mouth once a week. 08/31/20   [provider]    Allergies    Ibuprofen, Excedrin extra strength [aspirin-acetaminophen-caffeine], Excedrin migraine  [asa-apap-caff buffered], Oxycodone, and Vicodin [hydrocodone-acetaminophen]  Review of Systems   Review of Systems  All other systems reviewed and are negative.   Physical Exam Updated Vital Signs BP 102/60 (BP Location: Left Arm)   Pulse 73   Temp 97.6 F (36.4 C) (Oral)   Resp 17   LMP 08/23/2020   SpO2 100%   Physical Exam Vitals and nursing note reviewed.  Constitutional:      General: She is not in acute distress.    Appearance: She is well-developed.  HENT:     Head: Normocephalic and atraumatic.     Right Ear: External ear normal.     Left Ear: External ear normal.  Eyes:     General: No scleral icterus.       Right eye: No discharge.        Left eye: No discharge.     Conjunctiva/sclera: Conjunctivae normal.  Neck:     Trachea: No tracheal deviation.  Cardiovascular:     Rate and Rhythm: Normal rate and regular rhythm.  Pulmonary:     Effort: Pulmonary effort is normal. No respiratory distress.     Breath sounds: Normal  breath sounds. No stridor. No wheezing or rales.  Abdominal:     General: Bowel sounds are normal. There is no distension.     Palpations: Abdomen is soft.     Tenderness: There is no abdominal tenderness. There is no guarding or rebound.  Musculoskeletal:        General: No tenderness.     Cervical back: Neck supple.  Skin:    General: Skin is warm and dry.     Findings: No rash.  Neurological:     Mental Status: She is alert.     Cranial Nerves: No cranial nerve deficit (no facial droop, extraocular movements intact, no slurred speech).     Sensory: No sensory deficit.     Motor: No abnormal muscle tone or seizure activity.     Coordination: Coordination normal.     ED Results / Procedures / Treatments   Labs (all labs ordered are listed, but only abnormal results are displayed) Labs Reviewed  BASIC METABOLIC PANEL - Abnormal; Notable for the following components:      Result Value   Creatinine, Ser 1.06 (*)    All other components within normal limits  CBC - Abnormal; Notable for the following components:   Hemoglobin 11.3 (*)    HCT 34.6 (*)    All other components within normal limits  CBG MONITORING, ED - Abnormal; Notable for the following components:   Glucose-Capillary 103 (*)    All other components within normal limits  D-DIMER, QUANTITATIVE  PREGNANCY, URINE  TROPONIN I (HIGH SENSITIVITY)    EKG EKG Interpretation  Date/Time:  Thursday September 12 2020 14:34:08 EDT Ventricular Rate:  76 PR Interval:  165 QRS Duration: 93 QT Interval:  397 QTC Calculation: 447 R Axis:   66 Text Interpretation: Sinus rhythm Nonspecific T wave abnormality ECG similar to previous ECG 30 August 2019 Confirmed by Linwood Dibbles [phone]) on 09/12/2020 2:44:29 PM   Radiology DG Chest Portable 1 View  Result Date: 09/12/2020 CLINICAL DATA:  Chest pain EXAM: PORTABLE CHEST 1 VIEW COMPARISON:  08/30/2019 FINDINGS:  The heart size and mediastinal contours are within normal limits. Both  lungs are clear. The visualized skeletal structures are unremarkable. IMPRESSION: Negative. Electronically Signed   By: Charlett NoseKevin  Dover M.D.   On: 09/12/2020 15:15   DG Foot Complete Right  Result Date: 09/12/2020 Please see detailed radiograph report in office note.   Procedures Procedures   Medications Ordered in ED Medications  acetaminophen (TYLENOL) tablet 650 mg (650 mg Oral Given 09/12/20 1454)    ED Course  I have reviewed the triage vital signs and the nursing notes.  Pertinent labs & imaging results that were available during my care of the patient were reviewed by me and considered in my medical decision making (see chart for details).  Clinical Course as of 09/12/20 1549  Thu Sep 12, 2020  1544 Patient's initial troponin is normal.  Blood count and metabolic panel are normal [JK]  1544 Chest x-ray is negative [JK]    Clinical Course User Index [JK] Linwood DibblesKnapp, Chianti Goh, MD   MDM Rules/Calculators/A&P HEAR Score: 1                        Patient presented to ED for evaluation of sharp pleuritic chest pain.  Initial laboratory tests are reassuring.  No signs to suggest PE.  Chest x-ray without pneumonia.  Initial troponin is normal.  Patient's overall low risk.  Symptoms just recently started again so will check delta troponin.  If negative will consider discharge home with NSAIDs for symptomatic treatment of her pleuritic chest pain Final Clinical Impression(s) / ED Diagnoses Final diagnoses:  Chest pain, unspecified type    Rx / DC Orders ED Discharge Orders    None       Linwood DibblesKnapp, Clerence Gubser, MD 09/12/20 1549

## 2020-09-12 NOTE — ED Provider Notes (Signed)
4:02 PM Care assumed from Dr. Lynelle Doctor.  At time of transfer of care, patient is awaiting results of delta troponin.  Plan of care is to discharge and follow-up with PCP if delta troponin is negative.  7:04 PM Delta troponin is negative.  Patient is reporting her pain is more when she moves and twists.  Suspect more musculoskeletal etiology.  Due to possible muscle soreness and muscle spasm, will give muscle relaxant and she will follow-up with PCP.  Other work-up was also reassuring.  We went over all the together.  Patient with questions or concerns and was discharged in good condition with understanding of plan of care and follow-up instructions.   Clinical Impression: 1. Chest pain, unspecified type     Disposition: Discharge  Condition: Good  I have discussed the results, Dx and Tx plan with the pt(& family if present). He/she/they expressed understanding and agree(s) with the plan. Discharge instructions discussed at great length. Strict return precautions discussed and pt &/or family have verbalized understanding of the instructions. No further questions at time of discharge.    New Prescriptions   CYCLOBENZAPRINE (FLEXERIL) 10 MG TABLET    Take 1 tablet (10 mg total) by mouth 2 (two) times daily as needed for muscle spasms.    Follow Up: Ellender Hose, NP [address] [place] [address] [phone]     [facility] [address] [place] [address] [phone]         Michaele Amundson, Canary Brim, MD 09/12/20 Windell Moment

## 2020-09-12 NOTE — ED Notes (Signed)
Pt verbalizes understanding of discharge instructions. Opportunity for questioning and answers were provided. Armand removed by staff, pt discharged from ED ambulatory to Home via SELF.   

## 2020-09-12 NOTE — ED Triage Notes (Signed)
Right upper chest pain with ROM of right shoulder and with deep breathing. Slight shortness of breath per pt.

## 2020-09-12 NOTE — ED Notes (Signed)
Pt ambulatory with steady gait to restroom to provide urine specimen 

## 2020-09-14 NOTE — Progress Notes (Signed)
Subjective:  Patient ID: Mallory Martin, female    DOB: Oct 06, 1975,  MRN: [id number] HPI Chief Complaint  Patient presents with  . Foot Pain    Right foot pain - Patient states it has been ongoing , she feels that there is a tightness feeling in her foot.     45 y.o. female presents with the above complaint.   ROS: Denies fever chills nausea vomiting muscle aches pains calf pain back pain chest pain shortness of breath.  Past Medical History:  Diagnosis Date  . Abnormal first trimester screen 11/16/2013  . Acute blood loss anemia 03/20/2014  . Allergic rhinitis 08/29/2007   Formatting of this note might be different from the original. Allergic Rhinitis - Dr. Barnetta Chapel  10/1 IMO update  . Allergy   . Anemia   . Asthma   . Asthma 08/24/2007   Formatting of this note might be different from the original. Asthma - Dr. Barnetta Chapel  ICD-10 cut over  . Cardiac murmur 09/01/2019  . Chest discomfort 09/01/2019  . Constipation   . Dysmenorrhea 11/11/2007   Formatting of this note might be different from the original. Dysmenorrhea  . Encounter for preconception consultation 04/19/2013   Last Assessment & Plan:  Formatting of this note might be different from the original. Advised starting prenatal vitamins now, avoiding potential teratogens, and intercourse every 2-3 days regardless of timing.  Refilled OCP x 1 month.  Given patient's age, she will return for further eval of unable to become pregnant in 6 months  . 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 Cold sores since age 39  . Migraines    menstrual migraines  . Postpartum care following cesarean delivery (10/24) 03/17/2014  . Skin tag 04/19/2013   Last Assessment & Plan:  Formatting of this note might be different from the original. Irritated, painful, treated by cryotherapy today.  Discussed aftercare and red flag for return  . TMJ (temporomandibular joint disorder) 04/19/2013   Last Assessment &  Plan:  Formatting of this note might be different from the original. Patient has taken naprosyn with no side effects and good relief in the past.  Will rx today.  Gave info handout on lifestyle changes to help with pain.  [name] Umbilical hernia with obstruction but no gangrene 12/18/2013   Past Surgical History:  Procedure Laterality Date  . CESAREAN SECTION  2002  . CESAREAN SECTION N/A 03/17/2014   Procedure: CESAREAN SECTION;  Surgeon: Tresa Endo A. Ernestina Penna, MD;  Location: [facility];  Service: Obstetrics;  Laterality: N/A;  repeat c-section  . HERNIA REPAIR    . INSERTION OF MESH N/A 04/14/2016   Procedure: INSERTION OF MESH;  Surgeon: Claud Kelp, MD;  Location: [facility];  Service: General;  Laterality: N/A;  INSERTION OF MESH  . UMBILICAL HERNIA REPAIR N/A 04/14/2016   Procedure: OPEN REPAIR INCARCERATED UMBILICAL HERNIA WITH MESH;  Surgeon: Claud Kelp, MD;  Location: [facility];  Service: General;  Laterality: N/A;  OPEN REPAIR INCARCERATED UMBILICAL HERNIA WITH MESH  . WISDOM TOOTH EXTRACTION      Current Outpatient Medications:  .  methylPREDNISolone (MEDROL DOSEPAK) 4 MG TBPK tablet, 6 day dose pack - take as directed, Disp: 21 tablet, Rfl: 0 .  acetaminophen (TYLENOL) 500 MG tablet, Take 500 mg by mouth every 6 (six) hours as needed for mild pain. , Disp: , Rfl:  .  albuterol (VENTOLIN HFA) 108 (90 Base) MCG/ACT inhaler,  Inhale 2 puffs into the lungs every 6 (six) hours as needed for wheezing., Disp: , Rfl:  .  azithromycin (ZITHROMAX) 250 MG tablet, Take by mouth as directed., Disp: , Rfl:  .  benzonatate (TESSALON) 100 MG capsule, Take 100 mg by mouth 3 (three) times daily as needed., Disp: , Rfl:  .  cyclobenzaprine (FLEXERIL) 10 MG tablet, Take 1 tablet (10 mg total) by mouth 2 (two) times daily as needed for muscle spasms., Disp: 20 tablet, Rfl: 0 .  levothyroxine (SYNTHROID) 50 MCG tablet, Take 50 mcg by mouth daily., Disp: , Rfl:  .  LINZESS 145 MCG  CAPS capsule, Take 145 mcg by mouth daily., Disp: , Rfl:  .  Multiple Vitamins-Minerals (MULTIVITAMIN WITH MINERALS) tablet, Take 1 tablet by mouth daily., Disp: , Rfl:  .  Vitamin D, Ergocalciferol, (DRISDOL) 1.25 MG (50000 UNIT) CAPS capsule, Take 1 capsule by mouth once a week., Disp: , Rfl:   Allergies  Allergen Reactions  . Ibuprofen Swelling    08/30/19: States she can take naproxen 08/30/19: States she can take naproxen  . Excedrin Extra Strength [Aspirin-Acetaminophen-Caffeine] Swelling    Excedrin Migraine  . Excedrin Migraine  [Asa-Apap-Caff Buffered] Swelling  . Oxycodone Nausea Only  . Vicodin [Hydrocodone-Acetaminophen] Nausea Only   Review of Systems Objective:  There were no vitals filed for this visit.  General: Well developed, nourished, in no acute distress, alert and oriented x3   Dermatological: Skin is warm, dry and supple bilateral. Nails x 10 are well maintained; remaining integument appears unremarkable at this time. There are no open sores, no preulcerative lesions, no rash or signs of infection present.  Vascular: Dorsalis Pedis artery and Posterior Tibial artery pedal pulses are 2/4 bilateral with immedate capillary fill time. Pedal hair growth present. No varicosities and no lower extremity edema present bilateral.   Neruologic: Grossly intact via light touch bilateral. Vibratory intact via tuning fork bilateral. Protective threshold with Semmes Wienstein monofilament intact to all pedal sites bilateral. Patellar and Achilles deep tendon reflexes 2+ bilateral. No Babinski or clonus noted bilateral.   Musculoskeletal: No gross boney pedal deformities bilateral. No pain, crepitus, or limitation noted with foot and ankle range of motion bilateral. Muscular strength 5/5 in all groups tested bilateral.  She has pain on palpation and end range of motion of the second and third metatarsophalangeal joints of the right foot.  Minimal edema.  Gait: Unassisted, Nonantalgic.     Radiographs:  Radiographs taken today demonstrate osseously mature individual no acute findings.  Assessment & Plan:   Assessment: Capsulitis second and third metatarsophalangeal joints right foot.  Plan: I injected the area today with 10 mg Kenalog 5 mg Marcaine plain injecting around the capsule.  Did not inject intra-articularly.  Started her on a Darco shoe and will follow up with her in 1 month to 6 weeks.     Kaitlin Alcindor T. [place], [place]

## 2020-09-18 ENCOUNTER — Other Ambulatory Visit: Payer: Self-pay

## 2020-09-26 ENCOUNTER — Other Ambulatory Visit: Payer: Self-pay

## 2020-09-26 ENCOUNTER — Encounter: Payer: Self-pay | Admitting: Cardiology

## 2020-09-26 ENCOUNTER — Telehealth (INDEPENDENT_AMBULATORY_CARE_PROVIDER_SITE_OTHER): Payer: 59 | Admitting: Cardiology

## 2020-09-26 ENCOUNTER — Telehealth: Payer: Self-pay

## 2020-09-26 VITALS — Ht 62.0 in | Wt 192.0 lb

## 2020-09-26 DIAGNOSIS — R0789 Other chest pain: Secondary | ICD-10-CM | POA: Diagnosis not present

## 2020-09-26 DIAGNOSIS — E782 Mixed hyperlipidemia: Secondary | ICD-10-CM | POA: Diagnosis not present

## 2020-09-26 NOTE — Patient Instructions (Signed)

## 2020-09-26 NOTE — Progress Notes (Signed)
Virtual Visit via Telephone Note   This visit type was conducted due to national recommendations for restrictions regarding the COVID-19 Pandemic (e.g. social distancing) in an effort to limit this patient's exposure and mitigate transmission in our community.  Due to her co-morbid illnesses, this patient is at least at moderate risk for complications without adequate follow up.  This format is felt to be most appropriate for this patient at this time.  The patient did not have access to video technology/had technical difficulties with video requiring transitioning to audio format only (telephone).  All issues noted in this document were discussed and addressed.  No physical exam could be performed with this format.  Please refer to the patient's chart for her  consent to telehealth for [facility].    Date:  09/26/2020   ID:  Mallory Martin, DOB November 16, 1975, MRN [id number] The patient was identified using 2 identifiers.  Patient Location: Home Provider Location: Home Office   PCP:  Ellender Hose, NP   [facility] Providers Cardiologist:  None     Evaluation Performed:  Follow-Up Visit  Chief Complaint: Chest pain  History of Present Illness:    Mallory Martin is a 45 y.o. female with past medical history that is not very significant.  She mentions to me that recently she has been told that her cholesterol is elevated.  No orthopnea or PND.  She was recently evaluated for chest pain and he reviewed those notes and records.  She tells me that she had chest pain in the left side of her chest.  No radiation to the neck or to the arms.  Does not go with exercise.  She leads a sedentary lifestyle.  At the time of my evaluation, the patient is alert awake oriented and in no distress.  The patient does not have symptoms concerning for COVID-19 infection (fever, chills, cough, or new shortness of breath).    Past Medical History:  Diagnosis Date  . Abnormal first trimester screen  11/16/2013  . Acute blood loss anemia 03/20/2014  . Allergic rhinitis 08/29/2007   Formatting of this note might be different from the original. Allergic Rhinitis - Dr. Barnetta Chapel  10/1 IMO update  . Allergy   . Anemia   . Asthma 08/24/2007   Formatting of this note might be different from the original. Asthma - Dr. Barnetta Chapel  ICD-10 cut over  . Cardiac murmur 09/01/2019  . Chest discomfort 09/01/2019  . Constipation   . Dysmenorrhea 11/11/2007   Formatting of this note might be different from the original. Dysmenorrhea  . Encounter for preconception consultation 04/19/2013   Last Assessment & Plan:  Formatting of this note might be different from the original. Advised starting prenatal vitamins now, avoiding potential teratogens, and intercourse every 2-3 days regardless of timing.  Refilled OCP x 1 month.  Given patient's age, she will return for further eval of unable to become pregnant in 6 months  . 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 Cold sores since age 58  . Migraines    menstrual migraines  . Postpartum care following cesarean delivery (10/24) 03/17/2014  . Skin tag 04/19/2013   Last Assessment & Plan:  Formatting of this note might be different from the original. Irritated, painful, treated by cryotherapy today.  Discussed aftercare and red flag for return  . TMJ (temporomandibular joint disorder) 04/19/2013   Last Assessment & Plan:  Formatting of this note might  be different from the original. Patient has taken naprosyn with no side effects and good relief in the past.  Will rx today.  Gave info handout on lifestyle changes to help with pain.  [name] Umbilical hernia with obstruction but no gangrene 12/18/2013   Past Surgical History:  Procedure Laterality Date  . CESAREAN SECTION  2002  . CESAREAN SECTION N/A 03/17/2014   Procedure: CESAREAN SECTION;  Surgeon: Tresa Endo A. Ernestina Penna, MD;  Location: [facility];  Service: Obstetrics;  Laterality: N/A;   repeat c-section  . HERNIA REPAIR    . INSERTION OF MESH N/A 04/14/2016   Procedure: INSERTION OF MESH;  Surgeon: Claud Kelp, MD;  Location: [facility];  Service: General;  Laterality: N/A;  INSERTION OF MESH  . UMBILICAL HERNIA REPAIR N/A 04/14/2016   Procedure: OPEN REPAIR INCARCERATED UMBILICAL HERNIA WITH MESH;  Surgeon: Claud Kelp, MD;  Location: [facility];  Service: General;  Laterality: N/A;  OPEN REPAIR INCARCERATED UMBILICAL HERNIA WITH MESH  . WISDOM TOOTH EXTRACTION       No outpatient medications have been marked as taking for the 09/26/20 encounter (Video Visit) with Ledarrius Beauchaine, Aundra Dubin, MD.     Allergies:   Ibuprofen, Excedrin extra strength [aspirin-acetaminophen-caffeine], Excedrin migraine  [asa-apap-caff buffered], Oxycodone, and Vicodin [hydrocodone-acetaminophen]   Social History   Tobacco Use  . Smoking status: Never Smoker  . Smokeless tobacco: Never Used  Vaping Use  . Vaping Use: Never used  Substance Use Topics  . Alcohol use: No  . Drug use: No     Family Hx: The patient's family history includes Cancer in her mother; Heart disease in her mother; Mental illness in her sister.  ROS:   Please see the history of present illness.    I discussed my findings with the patient at length. All other systems reviewed and are negative.   Prior CV studies:   The following studies were reviewed today:  Reports from hospital evaluation recently were reviewed  Labs/Other Tests and Data Reviewed:    EKG:  I reviewed EKG  Recent Labs: 09/12/2020: BUN 14; Creatinine, Ser 1.06; Hemoglobin 11.3; Platelets 375; Potassium 3.6; Sodium 139   Recent Lipid Panel Lab Results  Component Value Date/Time   CHOL 191 06/07/2019 04:09 PM   TRIG 66 06/07/2019 04:09 PM   HDL 68 06/07/2019 04:09 PM   CHOLHDL 2.8 06/07/2019 04:09 PM   LDLCALC 111 (H) 06/07/2019 04:09 PM    Wt Readings from Last 3 Encounters:  09/26/20 192 lb (87.1 kg)   11/24/19 194 lb (88 kg)  10/02/19 192 lb 3.2 oz (87.2 kg)     Risk Assessment/Calculations:      Objective:    Vital Signs:  Ht 5\' 2"  (1.575 m)   Wt 192 lb (87.1 kg)   BMI 35.12 kg/m    VITAL SIGNS:  reviewed  ASSESSMENT & PLAN:    1. Chest pain: Atypical etiology.  Patient is concerned about the symptoms.  She had a nuclear stress test done about last year.  She leads a sedentary lifestyle.  She tells me that her only risk factors is elevated cholesterol.  Given this I will schedule her for an exercise stress echo.  If this is negative I have asked her to embark on an exercise program. 2. She is agreeable.  Primary prevention stressed. 3. Mixed dyslipidemia: Diet was emphasized.  This is followed by primary care provider.   Shared Decision Making/Informed Consent The risks [chest pain, shortness  of breath, cardiac arrhythmias, dizziness, blood pressure fluctuations, myocardial infarction, stroke/transient ischemic attack, and life-threatening complications (estimated to be 1 in 10,000)], benefits (risk stratification, diagnosing coronary artery disease, treatment guidance) and alternatives of a stress or dobutamine stress echocardiogram were discussed in detail with Ms. Iseminger and she agrees to proceed.      COVID-19 Education: The signs and symptoms of COVID-19 were discussed with the patient and how to seek care for testing (follow up with PCP or arrange E-visit).  The importance of social distancing was discussed today.  Time:   Today, I have spent 15 minutes with the patient with telehealth technology discussing the above problems.     Medication Adjustments/Labs and Tests Ordered: Current medicines are reviewed at length with the patient today.  Concerns regarding medicines are outlined above.   Tests Ordered: No orders of the defined types were placed in this encounter.   Medication Changes: No orders of the defined types were placed in this encounter.   Follow  Up:  In Person in 3 month(s)  Signed, Garwin Brothers, MD  09/26/2020 12:08 PM    [facility]

## 2020-09-26 NOTE — Telephone Encounter (Signed)
  Patient Consent for Virtual Visit         [name] has provided verbal consent on 09/26/2020 for a virtual visit (video or telephone).   CONSENT FOR VIRTUAL VISIT FOR:  Mallory Martin  By participating in this virtual visit I agree to the following:  I hereby voluntarily request, consent and authorize [facility] and its employed or contracted physicians, physician assistants, nurse practitioners or other licensed health care professionals (the Practitioner), to provide me with telemedicine health care services (the "Services") as deemed necessary by the treating Practitioner. I acknowledge and consent to receive the Services by the Practitioner via telemedicine. I understand that the telemedicine visit will involve communicating with the Practitioner through live audiovisual communication technology and the disclosure of certain medical information by electronic transmission. I acknowledge that I have been given the opportunity to request an in-person assessment or other available alternative prior to the telemedicine visit and am voluntarily participating in the telemedicine visit.  I understand that I have the right to withhold or withdraw my consent to the use of telemedicine in the course of my care at any time, without affecting my right to future care or treatment, and that the Practitioner or I may terminate the telemedicine visit at any time. I understand that I have the right to inspect all information obtained and/or recorded in the course of the telemedicine visit and may receive copies of available information for a reasonable fee.  I understand that some of the potential risks of receiving the Services via telemedicine include:  [name] Delay or interruption in medical evaluation due to technological equipment failure or disruption; . Information transmitted may not be sufficient (e.g. poor resolution of images) to allow for appropriate medical decision making by the Practitioner;  and/or  . In rare instances, security protocols could fail, causing a breach of personal health information.  Furthermore, I acknowledge that it is my responsibility to provide information about my medical history, conditions and care that is complete and accurate to the best of my ability. I acknowledge that Practitioner's advice, recommendations, and/or decision may be based on factors not within their control, such as incomplete or inaccurate data provided by me or distortions of diagnostic images or specimens that may result from electronic transmissions. I understand that the practice of medicine is not an exact science and that Practitioner makes no warranties or guarantees regarding treatment outcomes. I acknowledge that a copy of this consent can be made available to me via my patient portal [facility] MyChart), or I can request a printed copy by calling the office of [facility].    I understand that my insurance will be billed for this visit.   I have read or had this consent read to me. . I understand the contents of this consent, which adequately explains the benefits and risks of the Services being provided via telemedicine.  . I have been provided ample opportunity to ask questions regarding this consent and the Services and have had my questions answered to my satisfaction. . I give my informed consent for the services to be provided through the use of telemedicine in my medical care

## 2020-09-27 NOTE — Addendum Note (Signed)
Addended by: Belva Crome R on: 09/27/2020 08:10 AM   Modules accepted: Orders

## 2020-09-27 NOTE — Addendum Note (Signed)
Addended by: Delorse Limber I on: 09/27/2020 08:10 AM   Modules accepted: Orders

## 2020-10-17 ENCOUNTER — Other Ambulatory Visit: Payer: Self-pay

## 2020-10-17 ENCOUNTER — Ambulatory Visit
Admission: RE | Admit: 2020-10-17 | Discharge: 2020-10-17 | Disposition: A | Discharge status: Home / Self Care | Payer: BC Managed Care – PPO | Source: Ambulatory Visit | Attending: Family Medicine | Admitting: Family Medicine

## 2020-10-17 DIAGNOSIS — N63 Unspecified lump in unspecified breast: Secondary | ICD-10-CM

## 2020-10-24 ENCOUNTER — Ambulatory Visit (INDEPENDENT_AMBULATORY_CARE_PROVIDER_SITE_OTHER): Payer: BC Managed Care – PPO | Admitting: Podiatry

## 2020-10-24 ENCOUNTER — Other Ambulatory Visit: Payer: Self-pay

## 2020-10-24 DIAGNOSIS — M778 Other enthesopathies, not elsewhere classified: Secondary | ICD-10-CM

## 2020-10-24 MED ORDER — DEXAMETHASONE SODIUM PHOSPHATE 120 MG/30ML IJ SOLN
2.0000 mg | Freq: Once | INTRAMUSCULAR | Status: AC
  Administered 2020-10-24: 2 mg via INTRA_ARTICULAR

## 2020-10-26 NOTE — Progress Notes (Signed)
She presents today after 6-week follow-up of her right foot states that the pain is come back and it seems to be getting worse she states that the pain has moved forward to her toes.  And he started to experience similar pain on the contralateral foot.  Objective: Vital signs are stable alert and oriented x3.  Pulses are palpable.  She has pain on palpation of the second interdigital space and metatarsophalangeal joints #2 #3 of the right foot.  Similarly left.  Assessment: Capsulitis neuritis of the second third metatarsophalangeal joint and second interdigital space.  Plan: We discussed appropriate shoe gear and I also injected 4 mg of dexamethasone and local anesthetic to the interdigital space I will follow-up with her in 6 weeks if not improved may need to consider orthoses.

## 2020-12-12 ENCOUNTER — Ambulatory Visit: Payer: BC Managed Care – PPO | Admitting: Podiatry

## 2020-12-26 ENCOUNTER — Other Ambulatory Visit: Payer: Self-pay

## 2020-12-26 ENCOUNTER — Ambulatory Visit (INDEPENDENT_AMBULATORY_CARE_PROVIDER_SITE_OTHER): Payer: BC Managed Care – PPO | Admitting: Podiatry

## 2020-12-26 DIAGNOSIS — G5782 Other specified mononeuropathies of left lower limb: Secondary | ICD-10-CM

## 2020-12-26 DIAGNOSIS — G5762 Lesion of plantar nerve, left lower limb: Secondary | ICD-10-CM

## 2020-12-26 MED ORDER — TRIAMCINOLONE ACETONIDE 40 MG/ML IJ SUSP
20.0000 mg | Freq: Once | INTRAMUSCULAR | Status: AC
Start: 2020-12-26 — End: 2020-12-26
  Administered 2020-12-26: 20 mg

## 2020-12-29 NOTE — Progress Notes (Signed)
She presents today states that with capsulitis in the right foot seems to be doing much better but now she is having problems in the left foot.  States that it feels like a rubber band is pulling around her toes she is tried sandals Birkenstocks in particular.  To no avail.  Objective: Vital signs are stable alert and oriented x3.  Pulses are palpable.  No reproducible pain on palpation right foot though the left does demonstrate pain to the third interdigital space of the left foot with a palpable Mulder's click.  There is some radiating pain.  Assessment: Neuroma third interdigital space.  Plan: I injected her first dose of Kenalog today 20 mg Kenalog 5 mg Marcaine point of maximal tenderness.  She tolerated procedure well without complications none follow-up with her in 1 month may need to discuss alcohol injections.

## 2021-01-09 ENCOUNTER — Telehealth: Payer: Self-pay | Admitting: Podiatry

## 2021-01-09 NOTE — Telephone Encounter (Signed)
Patient called the office stating she has a broken left toe next to her pinky toe and has been using buddy tape on it and wants to know if there is any other advice that you can give her.

## 2021-01-30 ENCOUNTER — Ambulatory Visit (INDEPENDENT_AMBULATORY_CARE_PROVIDER_SITE_OTHER): Payer: BC Managed Care – PPO | Admitting: Podiatry

## 2021-01-30 ENCOUNTER — Encounter: Payer: Self-pay | Admitting: Podiatry

## 2021-01-30 ENCOUNTER — Ambulatory Visit (INDEPENDENT_AMBULATORY_CARE_PROVIDER_SITE_OTHER): Payer: BC Managed Care – PPO

## 2021-01-30 ENCOUNTER — Other Ambulatory Visit: Payer: Self-pay

## 2021-01-30 DIAGNOSIS — S90121A Contusion of right lesser toe(s) without damage to nail, initial encounter: Secondary | ICD-10-CM | POA: Diagnosis not present

## 2021-01-30 DIAGNOSIS — G5762 Lesion of plantar nerve, left lower limb: Secondary | ICD-10-CM

## 2021-01-30 DIAGNOSIS — G5782 Other specified mononeuropathies of left lower limb: Secondary | ICD-10-CM

## 2021-01-30 NOTE — Progress Notes (Signed)
She presents today for follow-up of her neuroma to the third interdigital space of her left foot she states that is about 50% improved so it seems to be doing okay at all think need any further injections at this point.  States that on January 02, 2021 she kicked a ladder in her house and injuring her fourth toe.  She denies fever chills nausea vomiting muscle aches and pains but states that she has been buddy wrapping the fourth toe.  Objective: Vital signs are stable alert oriented x3 minimal reproducible pain on palpation third interdigital space of the left foot.  Mildly tender.  No edema no erythema cellulitis drainage or odor no signs of infection.  Fourth toe right foot does demonstrate mild postinflammatory hyperpigmentation it is mildly tender on palpation at the PIPJ.  Radiographs taken today do not demonstrate any type of osseous abnormalities this toe.  Assessment: Contusion fourth toe right foot and slowly resolving neuroma third interdigital space left foot.

## 2021-03-19 IMAGING — CR DG CHEST 2V
2 series · 2 of 2 positions shown · non-contrast
Comparison: None.

CLINICAL DATA: Left-sided chest pain. History of asthma.

EXAM:
CHEST - 2 VIEW

[chest pa]
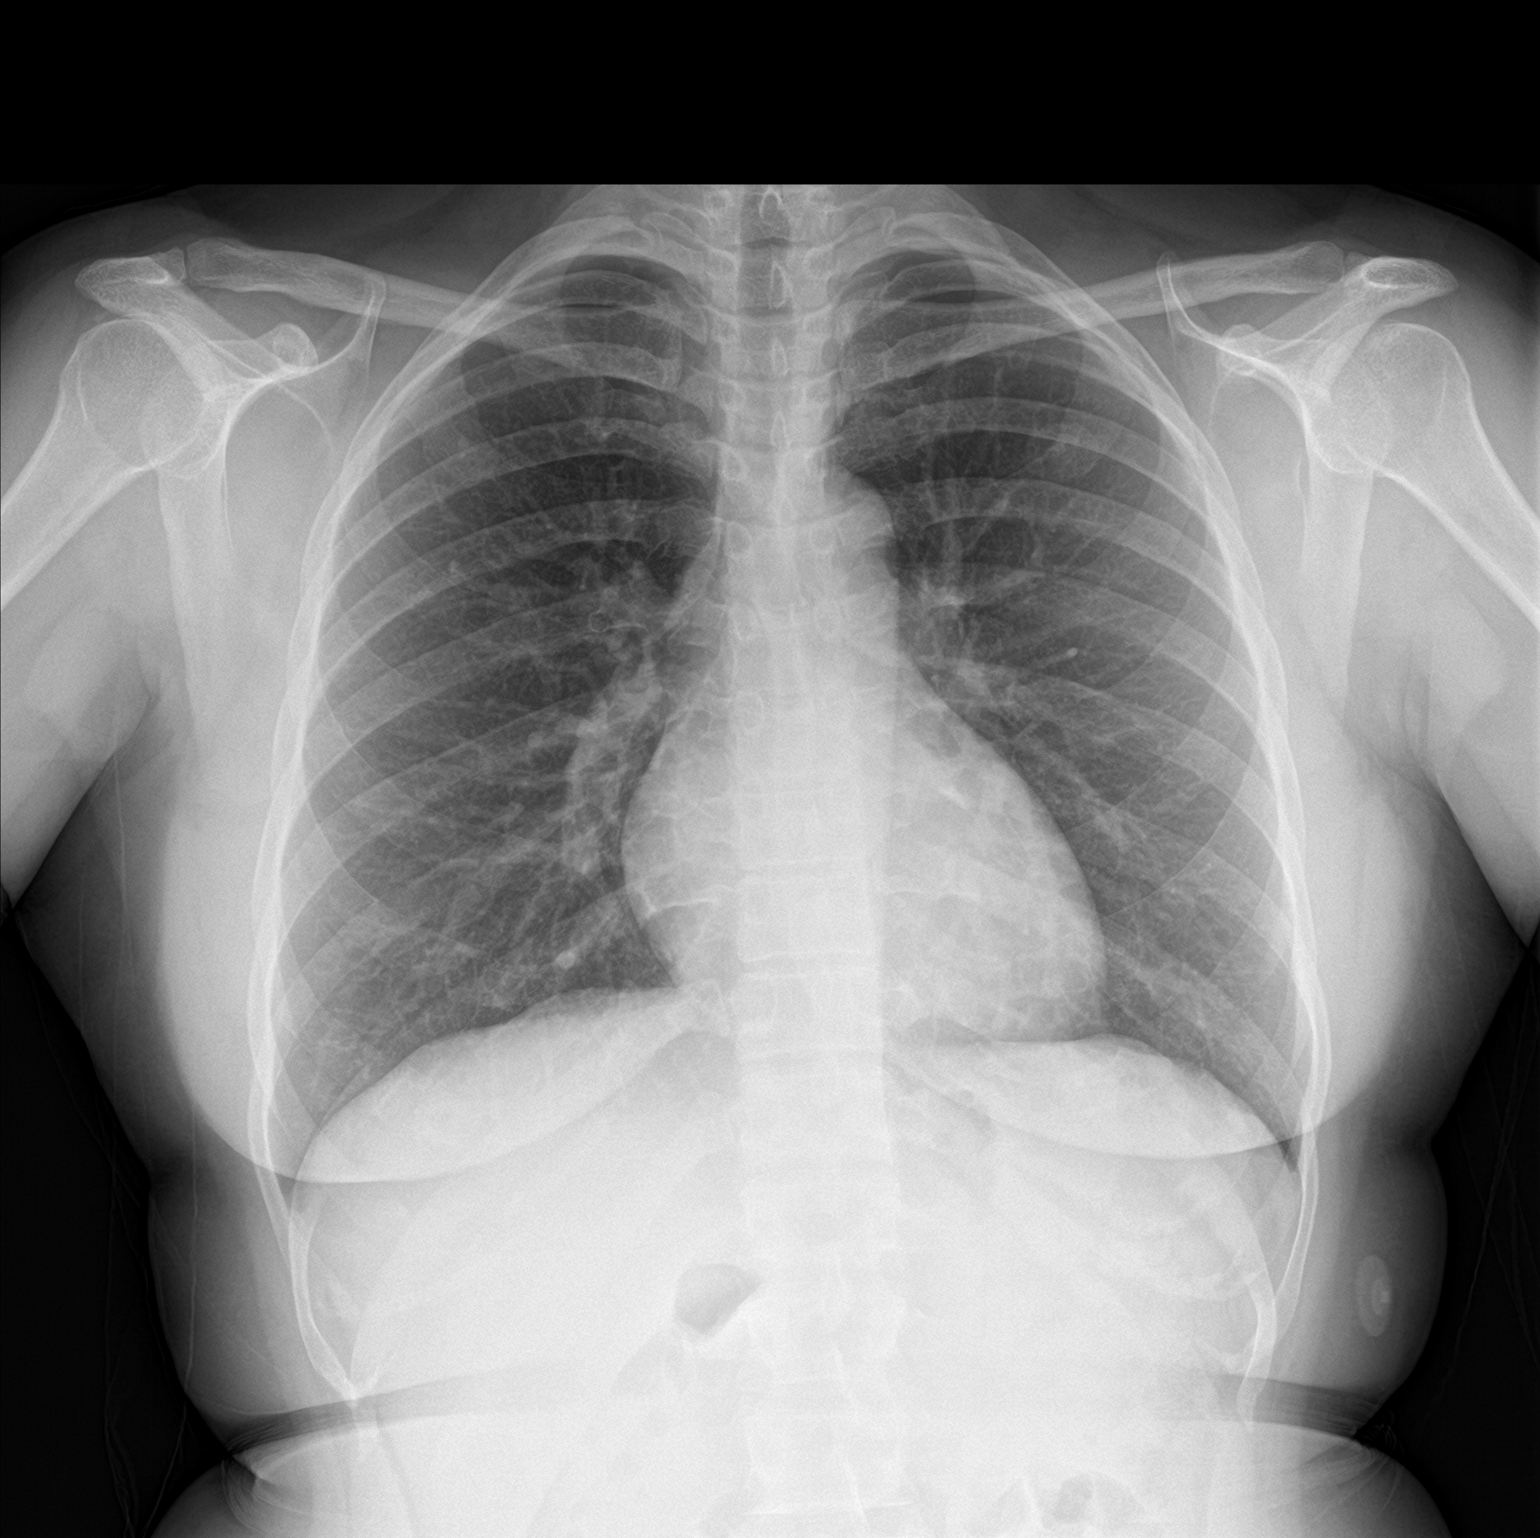

[chest lat]
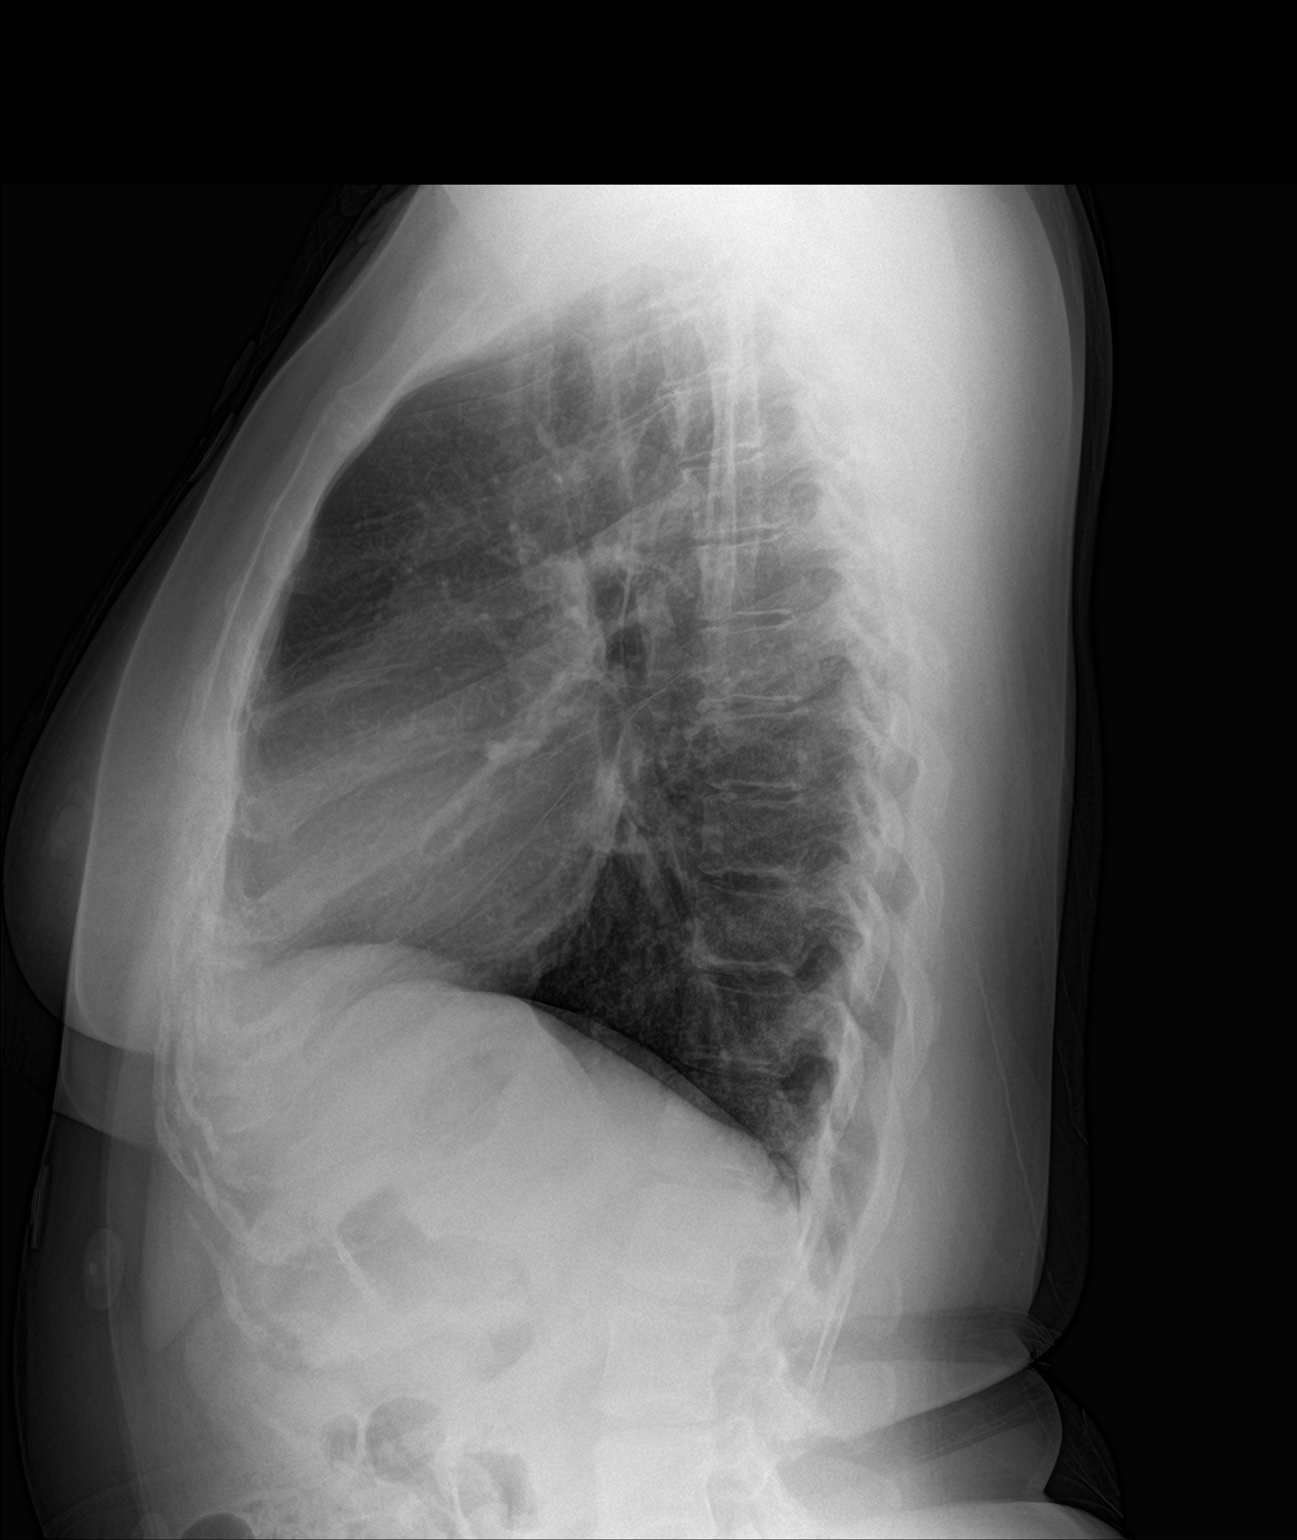

[2 of 2 positions shown; findings below may reference images not displayed]

FINDINGS: The cardiomediastinal contours are normal. The lungs are clear.
Pulmonary vasculature is normal. No consolidation, pleural effusion,
or pneumothorax. No acute osseous abnormalities are seen.
IMPRESSION: Negative radiographs of the chest.

## 2021-12-03 ENCOUNTER — Other Ambulatory Visit: Payer: Self-pay | Admitting: Family Medicine

## 2021-12-03 DIAGNOSIS — Z1231 Encounter for screening mammogram for malignant neoplasm of breast: Secondary | ICD-10-CM

## 2021-12-25 ENCOUNTER — Ambulatory Visit
Admission: RE | Admit: 2021-12-25 | Discharge: 2021-12-25 | Disposition: A | Discharge status: Home / Self Care | Payer: BC Managed Care – PPO | Source: Ambulatory Visit | Attending: Family Medicine | Admitting: Family Medicine

## 2021-12-25 DIAGNOSIS — Z1231 Encounter for screening mammogram for malignant neoplasm of breast: Secondary | ICD-10-CM

## 2021-12-29 ENCOUNTER — Other Ambulatory Visit: Payer: Self-pay | Admitting: Family Medicine

## 2021-12-29 DIAGNOSIS — R928 Other abnormal and inconclusive findings on diagnostic imaging of breast: Secondary | ICD-10-CM

## 2022-01-02 ENCOUNTER — Other Ambulatory Visit: Payer: Self-pay | Admitting: Obstetrics

## 2022-01-02 DIAGNOSIS — R928 Other abnormal and inconclusive findings on diagnostic imaging of breast: Secondary | ICD-10-CM

## 2022-01-05 ENCOUNTER — Ambulatory Visit
Admission: RE | Admit: 2022-01-05 | Discharge: 2022-01-05 | Disposition: A | Discharge status: Home / Self Care | Payer: BC Managed Care – PPO | Source: Ambulatory Visit | Attending: Family Medicine | Admitting: Family Medicine

## 2022-01-05 ENCOUNTER — Ambulatory Visit: Payer: BC Managed Care – PPO

## 2022-01-05 DIAGNOSIS — R928 Other abnormal and inconclusive findings on diagnostic imaging of breast: Secondary | ICD-10-CM

## 2023-11-04 ENCOUNTER — Ambulatory Visit: Admitting: Podiatry

## 2023-11-04 ENCOUNTER — Encounter: Payer: Self-pay | Admitting: Podiatry

## 2023-11-04 ENCOUNTER — Ambulatory Visit (INDEPENDENT_AMBULATORY_CARE_PROVIDER_SITE_OTHER)

## 2023-11-04 DIAGNOSIS — M7662 Achilles tendinitis, left leg: Secondary | ICD-10-CM

## 2023-11-04 DIAGNOSIS — M722 Plantar fascial fibromatosis: Secondary | ICD-10-CM

## 2023-11-04 MED ORDER — METHYLPREDNISOLONE 4 MG PO TBPK: ORAL_TABLET | ORAL | 0 refills | Status: AC

## 2023-11-04 MED ORDER — MELOXICAM 15 MG PO TABS: 15.0000 mg | ORAL_TABLET | Freq: Every day | ORAL | 3 refills | Status: AC

## 2023-11-04 MED ORDER — TRIAMCINOLONE ACETONIDE 40 MG/ML IJ SUSP
20.0000 mg | Freq: Once | INTRAMUSCULAR | Status: AC
  Administered 2023-11-04: 20 mg

## 2023-11-04 NOTE — Progress Notes (Signed)
 Subjective:  Patient ID: Mallory Martin, female    DOB: [birth year],  MRN: [id number] HPI Chief Complaint  Patient presents with   Foot Pain    Posterior heel/achilles left - aching, pulling May 5th, no injury, went to [facility], said had spur, wearing boot since-helped some   New Patient (Initial Visit)    Est pt 01/2021    48 y.o. female presents with the above complaint.   ROS: Denies fever chills nausea vomit muscle aches pains calf pain back pain chest pain shortness of breath.  Past Medical History:  Diagnosis Date   Abnormal first trimester screen 11/16/2013   Acute blood loss anemia 03/20/2014   Allergic rhinitis 08/29/2007   Formatting of this note might be different from the original. Allergic Rhinitis - Dr. Valera Gaster  10/1 IMO update   Allergy    Anemia    Asthma 08/24/2007   Formatting of this note might be different from the original. Asthma - Dr. Valera Gaster  ICD-10 cut over   Cardiac murmur 09/01/2019   Chest discomfort 09/01/2019   Constipation    Dysmenorrhea 11/11/2007   Formatting of this note might be different from the original. Dysmenorrhea   Encounter for preconception consultation 04/19/2013   Last Assessment & Plan:  Formatting of this note might be different from the original. Advised starting prenatal vitamins now, avoiding potential teratogens, and intercourse every 2-3 days regardless of timing.  Refilled OCP x 1 month.  Given patient's age, she will return for further eval of unable to become pregnant in 6 months   HSV-2 seropositive 11/23/2011   Formatting of this note might be different from the original. Cold sores since age 4   Migraines    menstrual migraines   Postpartum care following cesarean delivery (10/24) 03/17/2014   Skin tag 04/19/2013   Last Assessment & Plan:  Formatting of this note might be different from the original. Irritated, painful, treated by cryotherapy today.  Discussed aftercare and red flag for return   TMJ (temporomandibular joint  disorder) 04/19/2013   Last Assessment & Plan:  Formatting of this note might be different from the original. Patient has taken naprosyn  with no side effects and good relief in the past.  Will rx today.  Gave info handout on lifestyle changes to help with pain.   Umbilical hernia with obstruction but no gangrene 12/18/2013   Past Surgical History:  Procedure Laterality Date   CESAREAN SECTION  2002   CESAREAN SECTION N/A 03/17/2014   Procedure: CESAREAN SECTION;  Surgeon: Loetta Ringer A. Johnston Nao, MD;  Location: [facility];  Service: Obstetrics;  Laterality: N/A;  repeat c-section   HERNIA REPAIR     INSERTION OF MESH N/A 04/14/2016   Procedure: INSERTION OF MESH;  Surgeon: Boyce Byes, MD;  Location: [facility];  Service: General;  Laterality: N/A;  INSERTION OF MESH   UMBILICAL HERNIA REPAIR N/A 04/14/2016   Procedure: OPEN REPAIR INCARCERATED UMBILICAL HERNIA WITH MESH;  Surgeon: Boyce Byes, MD;  Location: [facility];  Service: General;  Laterality: N/A;  OPEN REPAIR INCARCERATED UMBILICAL HERNIA WITH MESH   WISDOM TOOTH EXTRACTION      Current Outpatient Medications:    meloxicam (MOBIC) 15 MG tablet, Take 1 tablet (15 mg total) by mouth daily., Disp: 30 tablet, Rfl: 3   methylPREDNISolone  (MEDROL  DOSEPAK) 4 MG TBPK tablet, 6 day dose pack - take as directed, Disp: 21 tablet, Rfl: 0   acetaminophen  (TYLENOL ) 500 MG tablet, Take 500 mg  by mouth every 6 (six) hours as needed for mild pain. , Disp: , Rfl:    albuterol (VENTOLIN HFA) 108 (90 Base) MCG/ACT inhaler, Inhale 2 puffs into the lungs every 6 (six) hours as needed for wheezing., Disp: , Rfl:    cyclobenzaprine  (FLEXERIL ) 10 MG tablet, Take 1 tablet (10 mg total) by mouth 2 (two) times daily as needed for muscle spasms., Disp: 20 tablet, Rfl: 0   ibuprofen (ADVIL) 800 MG tablet, Take 800 mg by mouth 2 (two) times daily as needed for pain., Disp: , Rfl:    levothyroxine (SYNTHROID) 50 MCG tablet, Take 50 mcg by  mouth daily., Disp: , Rfl:    LINZESS 145 MCG CAPS capsule, Take 145 mcg by mouth daily., Disp: , Rfl:    Multiple Vitamins-Minerals (MULTIVITAMIN WITH MINERALS) tablet, Take 1 tablet by mouth daily., Disp: , Rfl:    topiramate (TOPAMAX) 100 MG tablet, Take 100 mg by mouth at bedtime., Disp: , Rfl:    Vitamin D, Ergocalciferol, (DRISDOL) 1.25 MG (50000 UNIT) CAPS capsule, Take 1 capsule by mouth once a week., Disp: , Rfl:   Allergies  Allergen Reactions   Ibuprofen Swelling    08/30/19: States she can take naproxen  08/30/19: States she can take naproxen    Excedrin Migraine  [Asa-Apap-Caff Buffered] Swelling   Oxycodone  Nausea Only   Vicodin [Hydrocodone -Acetaminophen ] Nausea Only   Review of Systems Objective:  There were no vitals filed for this visit.  General: Well developed, nourished, in no acute distress, alert and oriented x3   Dermatological: Skin is warm, dry and supple bilateral. Nails x 10 are well maintained; remaining integument appears unremarkable at this time. There are no open sores, no preulcerative lesions, no rash or signs of infection present.  Vascular: Dorsalis Pedis artery and Posterior Tibial artery pedal pulses are 2/4 bilateral with immedate capillary fill time. Pedal hair growth present. No varicosities and no lower extremity edema present bilateral.   Neruologic: Grossly intact via light touch bilateral. Vibratory intact via tuning fork bilateral. Protective threshold with Semmes Wienstein monofilament intact to all pedal sites bilateral. Patellar and Achilles deep tendon reflexes 2+ bilateral. No Babinski or clonus noted bilateral.   Musculoskeletal: No gross boney pedal deformities bilateral. No pain, crepitus, or limitation noted with foot and ankle range of motion bilateral. Muscular strength 5/5 in all groups tested bilateral.  Pain on palpation medial calcaneal tubercle of the left heel.  There is also mild tenderness on palpation of the insertion site of the  Achilles tendon.  Gait: Unassisted, Nonantalgic.    Radiographs:  Radiographs taken today demonstrate osseously mature individual with good bone mineralization.  Soft tissue increase in density plantar fashion calcaneal insertion site as well as mild Achilles insertional inflammation.  No spurring is noted plantarly or posteriorly.  Assessment & Plan:   Assessment: Plantar fasciitis with compensatory Achilles tendinitis  Plan: Encouraged her to wear the cam boot that she was given by the orthopedic group.  Started her on methylprednisolone  to be followed by meloxicam.  Injected her plantar fascia today 20 mg Kenalog  5 mg Marcaine  point maximal tenderness.  Discussed appropriate shoe gear when she moves the boot she should wear shoe with a good heel counter.  She understands this is amenable to a we did discuss appropriate shoe gear stretching exercises ice therapy and shoe gear modifications.  Follow-up with her in 1 month if necessary     Hedda Crumbley T. [place], [place]

## 2023-11-09 ENCOUNTER — Ambulatory Visit: Admitting: Podiatry

## 2023-12-07 ENCOUNTER — Encounter: Payer: Self-pay | Admitting: Podiatry

## 2023-12-07 ENCOUNTER — Ambulatory Visit (INDEPENDENT_AMBULATORY_CARE_PROVIDER_SITE_OTHER): Admitting: Podiatry

## 2023-12-07 DIAGNOSIS — S93692A Other sprain of left foot, initial encounter: Secondary | ICD-10-CM | POA: Diagnosis not present

## 2023-12-07 DIAGNOSIS — S86012A Strain of left Achilles tendon, initial encounter: Secondary | ICD-10-CM | POA: Diagnosis not present

## 2023-12-07 MED ORDER — CELECOXIB 200 MG PO CAPS: 200.0000 mg | ORAL_CAPSULE | Freq: Two times a day (BID) | ORAL | 3 refills | Status: DC

## 2023-12-07 NOTE — Progress Notes (Signed)
 She presents today for follow-up of her Achilles tendinitis and plantar fasciitis.  She states that nothing seems to help it hurts all day most of the time and I have been having to double up on anti-inflammatories and I been wearing my boot until it started to make my knee and hip hurt.  Objective: Vital signs are stable alert oriented x 3.  Pulses are palpable.  Conservative therapies have failed at this point she still has really warm swollen Achilles near its insertion with some pain on the lateral aspect of the calcaneus as some fibers insert over there.  She also has severe pain with plantar fasciitis and a palpable deficit.  Assessment: Tear of the Achilles tendon and of the plantar fascia left.  Plan: Requesting MRI for differential diagnosis as well as surgical planning.

## 2023-12-08 ENCOUNTER — Encounter: Payer: Self-pay | Admitting: Podiatry

## 2023-12-18 ENCOUNTER — Ambulatory Visit
Admission: RE | Admit: 2023-12-18 | Discharge: 2023-12-18 | Disposition: A | Discharge status: Home / Self Care | Source: Ambulatory Visit | Attending: Podiatry | Admitting: Podiatry

## 2023-12-18 DIAGNOSIS — S86012A Strain of left Achilles tendon, initial encounter: Secondary | ICD-10-CM

## 2023-12-18 DIAGNOSIS — S93692A Other sprain of left foot, initial encounter: Secondary | ICD-10-CM

## 2023-12-21 ENCOUNTER — Ambulatory Visit: Payer: Self-pay | Admitting: Podiatry

## 2023-12-24 ENCOUNTER — Other Ambulatory Visit: Payer: Self-pay

## 2023-12-24 DIAGNOSIS — M7662 Achilles tendinitis, left leg: Secondary | ICD-10-CM

## 2023-12-24 DIAGNOSIS — M722 Plantar fascial fibromatosis: Secondary | ICD-10-CM

## 2024-05-11 ENCOUNTER — Other Ambulatory Visit: Payer: Self-pay | Admitting: Podiatry
